# Patient Record
Sex: Male | Born: 1940
Health system: Southern US, Community
[De-identification: ages and names within clinical notes are randomized; demographics above are authoritative.]

## PROBLEM LIST (undated history)

## (undated) DIAGNOSIS — E782 Mixed hyperlipidemia: Secondary | ICD-10-CM

## (undated) DIAGNOSIS — I219 Acute myocardial infarction, unspecified: Secondary | ICD-10-CM

## (undated) DIAGNOSIS — I779 Disorder of arteries and arterioles, unspecified: Secondary | ICD-10-CM

## (undated) DIAGNOSIS — R001 Bradycardia, unspecified: Secondary | ICD-10-CM

## (undated) DIAGNOSIS — E119 Type 2 diabetes mellitus without complications: Secondary | ICD-10-CM

## (undated) DIAGNOSIS — I10 Essential (primary) hypertension: Secondary | ICD-10-CM

## (undated) DIAGNOSIS — W57XXXA Bitten or stung by nonvenomous insect and other nonvenomous arthropods, initial encounter: Secondary | ICD-10-CM

## (undated) DIAGNOSIS — I739 Peripheral vascular disease, unspecified: Secondary | ICD-10-CM

## (undated) DIAGNOSIS — I2510 Atherosclerotic heart disease of native coronary artery without angina pectoris: Secondary | ICD-10-CM

## (undated) HISTORY — DX: Essential (primary) hypertension: I10

## (undated) HISTORY — DX: Mixed hyperlipidemia: E78.2

## (undated) HISTORY — DX: Acute myocardial infarction, unspecified: I21.9

## (undated) HISTORY — DX: Peripheral vascular disease, unspecified: I73.9

## (undated) HISTORY — DX: Bradycardia, unspecified: R00.1

## (undated) HISTORY — DX: Bitten or stung by nonvenomous insect and other nonvenomous arthropods, initial encounter: W57.XXXA

## (undated) HISTORY — DX: Type 2 diabetes mellitus without complications: E11.9

## (undated) HISTORY — DX: Disorder of arteries and arterioles, unspecified: I77.9

## (undated) HISTORY — DX: Atherosclerotic heart disease of native coronary artery without angina pectoris: I25.10

## (undated) HISTORY — PX: NO PAST SURGERIES: SHX2092

---

## 2008-05-16 DIAGNOSIS — I219 Acute myocardial infarction, unspecified: Secondary | ICD-10-CM

## 2008-05-16 HISTORY — DX: Acute myocardial infarction, unspecified: I21.9

## 2008-06-09 ENCOUNTER — Ambulatory Visit: Payer: Self-pay | Admitting: Internal Medicine

## 2008-06-09 ENCOUNTER — Encounter: Payer: Self-pay | Admitting: Cardiology

## 2008-06-09 ENCOUNTER — Inpatient Hospital Stay (HOSPITAL_COMMUNITY): Admission: EM | Admit: 2008-06-09 | Discharge: 2008-06-12 | Payer: Self-pay | Admitting: Cardiology

## 2008-06-10 ENCOUNTER — Encounter: Payer: Self-pay | Admitting: Cardiology

## 2008-06-10 ENCOUNTER — Ambulatory Visit: Payer: Self-pay | Admitting: Vascular Surgery

## 2008-06-10 ENCOUNTER — Encounter: Payer: Self-pay | Admitting: Internal Medicine

## 2008-06-24 ENCOUNTER — Ambulatory Visit: Payer: Self-pay | Admitting: Cardiology

## 2008-08-04 ENCOUNTER — Encounter: Payer: Self-pay | Admitting: Cardiology

## 2008-08-10 ENCOUNTER — Ambulatory Visit: Payer: Self-pay | Admitting: Cardiology

## 2008-10-29 ENCOUNTER — Encounter: Payer: Self-pay | Admitting: Cardiology

## 2009-03-09 ENCOUNTER — Ambulatory Visit: Payer: Self-pay | Admitting: Cardiology

## 2009-03-12 ENCOUNTER — Encounter: Payer: Self-pay | Admitting: Cardiology

## 2009-07-20 ENCOUNTER — Encounter: Payer: Self-pay | Admitting: Cardiology

## 2009-07-27 ENCOUNTER — Encounter: Payer: Self-pay | Admitting: Cardiology

## 2009-07-27 DIAGNOSIS — I6529 Occlusion and stenosis of unspecified carotid artery: Secondary | ICD-10-CM

## 2009-07-30 ENCOUNTER — Encounter: Payer: Self-pay | Admitting: Cardiology

## 2009-08-26 ENCOUNTER — Encounter: Payer: Self-pay | Admitting: Physician Assistant

## 2009-08-26 ENCOUNTER — Ambulatory Visit: Payer: Self-pay | Admitting: Cardiology

## 2009-08-26 DIAGNOSIS — E782 Mixed hyperlipidemia: Secondary | ICD-10-CM

## 2009-08-26 DIAGNOSIS — I10 Essential (primary) hypertension: Secondary | ICD-10-CM

## 2009-10-25 ENCOUNTER — Encounter: Payer: Self-pay | Admitting: Physician Assistant

## 2009-10-27 ENCOUNTER — Encounter (INDEPENDENT_AMBULATORY_CARE_PROVIDER_SITE_OTHER): Payer: Self-pay | Admitting: *Deleted

## 2010-01-05 ENCOUNTER — Encounter: Payer: Self-pay | Admitting: Cardiology

## 2010-01-11 ENCOUNTER — Encounter: Payer: Self-pay | Admitting: Cardiology

## 2010-02-15 ENCOUNTER — Ambulatory Visit: Payer: Self-pay | Admitting: Cardiology

## 2010-02-15 DIAGNOSIS — I2510 Atherosclerotic heart disease of native coronary artery without angina pectoris: Secondary | ICD-10-CM | POA: Insufficient documentation

## 2010-05-30 ENCOUNTER — Encounter: Payer: Self-pay | Admitting: Cardiology

## 2010-06-01 ENCOUNTER — Ambulatory Visit: Payer: Self-pay | Admitting: Cardiology

## 2010-06-01 ENCOUNTER — Encounter: Payer: Self-pay | Admitting: Cardiology

## 2010-06-06 ENCOUNTER — Ambulatory Visit: Payer: Self-pay | Admitting: Cardiology

## 2010-06-23 ENCOUNTER — Encounter: Payer: Self-pay | Admitting: Cardiology

## 2010-06-23 ENCOUNTER — Ambulatory Visit: Payer: Self-pay | Admitting: Vascular Surgery

## 2010-06-30 ENCOUNTER — Encounter: Payer: Self-pay | Admitting: Cardiology

## 2010-06-30 ENCOUNTER — Encounter: Admission: RE | Admit: 2010-06-30 | Discharge: 2010-06-30 | Payer: Self-pay | Admitting: Vascular Surgery

## 2010-06-30 ENCOUNTER — Ambulatory Visit: Payer: Self-pay | Admitting: Vascular Surgery

## 2010-10-20 ENCOUNTER — Encounter (INDEPENDENT_AMBULATORY_CARE_PROVIDER_SITE_OTHER): Payer: Self-pay | Admitting: *Deleted

## 2010-11-15 ENCOUNTER — Encounter: Payer: Self-pay | Admitting: Cardiology

## 2010-11-15 NOTE — Letter (Signed)
Summary: [facility] - Office Visit  [facility] - Office Visit   Imported By: Marylou Mccoy 07/18/2010 15:25:52  _____________________________________________________________________  External Attachment:    Type:   Image     Comment:   External Document

## 2010-11-15 NOTE — Miscellaneous (Signed)
Summary: Orders Update  Clinical Lists Changes  Orders: Added new Referral order of Carotid Duplex (Carotid Duplex) - Signed Added new Referral order of Nuclear Med (Nuc Med) - Signed

## 2010-11-15 NOTE — Miscellaneous (Signed)
Summary: CARTOID DOPPLERS  Clinical Lists Changes  Orders: Added new Referral order of Carotid Duplex (Carotid Duplex) - Signed

## 2010-11-15 NOTE — Letter (Signed)
Summary: Engineer, materials at [facility]  [address]   [place], [place] [address]   Phone: [phone]  Fax: [phone]        October 27, 2009 MRN: [id number]    Robert [name] [address] [place], [place]  [address]    Dear Mr. [name],  Your test ordered by Selena Batten has been reviewed by your physician (or physician assistant) and was found to be normal or stable. Your physician (or physician assistant) felt no changes were needed at this time.  ____ Echocardiogram  ____ Cardiac Stress Test  _X___ Lab Work-continue current medication regimen  ____ Peripheral vascular study of arms, legs or neck  ____ CT scan or X-ray  ____ Lung or Breathing test  ____ Other:   Thank you.   Cyril Loosen, RN, BSN    Duane Boston, M.D., F.A.C.C. Thressa Sheller, M.D., F.A.C.C. Oneal Grout, M.D., F.A.C.C. Cheree Ditto, M.D., F.A.C.C. Daiva Nakayama, M.D., F.A.C.C. Kenney Houseman, M.D., F.A.C.C. Jeanne Ivan, PA-C

## 2010-11-15 NOTE — Assessment & Plan Note (Signed)
Summary: 6 MON FUL FH   Visit Type:  Follow-up Primary Provider:  Dr. Erasmo Downer   History of Present Illness: 70 year old [name] presents for followup. I saw him back in May for routine followup. Followup testing is outlined below including carotid Dopplers and Cardiolite. He has shown progression in carotid stenosis on the left. We discussed this today as well as referral to vascular surgery.   He denies any significant angina or progressive shortness of breath. Cardiolite demonstrated evidence of scar without frank ischemia, LVEF 53%.  No description of progressive claudication symptoms. No palpitations or syncope.  Current Medications (verified): 1)  Plavix 75 Mg Tabs (Clopidogrel Bisulfate) .... Take One Tablet By Mouth Daily 2)  Carvedilol 6.25 Mg Tabs (Carvedilol) .... Take One Tablet By Mouth Twice A Day 3)  Crestor 10 Mg Tabs (Rosuvastatin Calcium) .... Take 1 Tab Daily 4)  Avodart 0.5 Mg Caps (Dutasteride) .... Take 1 Tablet By Mouth Once A Day 5)  Aspirin 81 Mg Tbec (Aspirin) .... Take One Tablet By Mouth Daily 6)  Metformin Hcl 500 Mg Tabs (Metformin Hcl) .... Take 1 Tablet By Mouth Two Times A Day 7)  Cozaar 100 Mg Tabs (Losartan Potassium) .... Take 1 Tablet By Mouth Once A Day 8)  Ambien 5 Mg Tabs (Zolpidem Tartrate) .... Take 1 Tablet By Mouth Once A Day As Needed 9)  Nitrostat 0.4 Mg Subl (Nitroglycerin) .... Use As Directed 10)  Viagra 100 Mg Tabs (Sildenafil Citrate) .... Use As Directed 11)  Claritin 10 Mg Tabs (Loratadine) .... Take 1 Tablet By Mouth Once A Day 12)  Hydrochlorothiazide 25 Mg Tabs (Hydrochlorothiazide) .... Take 1 Tablet By Mouth Once A Day  Allergies (verified): No Known Drug Allergies  Comments:  Nurse/Medical Assistant: The patient's medication bottles and allergies were reviewed with the patient and were updated in the Medication and Allergy Lists.  Past History:  Past Medical History: Last updated: 08/26/2009 Diabetes Type  2 Hyperlipidemia Hypertension Carotid artery disease (Robert-70%, bilateral, 10/10) Coronary artery disease - DES to LAD/OM August 2009 Myocardial Infarction - August 2009 out-of-hospital late presentation inferoposterolateral myocardial infarction Sinus bradycardia history of tobacco  Social History: Last updated: 08/10/2008 Retired  Tobacco Use - Former.  Alcohol Use - no  Review of Systems  The patient denies anorexia, fever, chest pain, syncope, dyspnea on exertion, peripheral edema, abdominal pain, melena, and hematochezia.         Otherwise reviewed and negative.  Vital Signs:  Patient profile:   70 year old [name] Height:      69 inches Weight:      202 pounds BMI:     29.94 Pulse rate:   57 / minute BP sitting:   137 / 85  (left arm) Cuff size:   regular  Vitals Entered By: Carlye Grippe (June 06, 2010 11:11 AM)  Nutrition Counseling: Patient's BMI is greater than 25 and therefore counseled on weight management options.  Physical Exam  Additional Exam:  Overweight [name] in no acute distress. HEENT: Conjunctiva and lids normal, oropharynx with moist mucosa. Neck: Supple, no elevated JVP, soft left carotid bruit. Lungs: Diminished breath sounds, nonlabored. Cardiac: Regular rate and rhythm, no S3 gallop. Abdomen: Soft, nontender, bowel sounds present. Skin: Warm and dry. Musculoskeletal: No kyphosis. Extremities: No pitting edema, distal pulses 2+. Neuropsychiatric: Alert and oriented x3, affect appropriate.   Carotid Doppler  Procedure date:  06/01/2010  Findings:      Greater than 70% LICA stenosis, Robert-69% RICA stenosis.  Nuclear  Study  Procedure date:  06/01/2010  Findings:      No diagnostic ST segment changes. Large inferolateral defect, fixed, consistent with previous infarct scar. No frank ischemia. LVEF 53%.  Impression & Recommendations:  Problem # 1:  CORONARY ATHEROSCLEROSIS NATIVE CORONARY ARTERY (ICD-414.01)  Symptomatically stable on  medical therapy. Followup Cardiolite shows inferolateral scar without frank ischemia, LVEF 53%. Followup in 6 months.  His updated medication list for this problem includes:    Plavix 75 Mg Tabs (Clopidogrel bisulfate) .Marland Kitchen... Take one tablet by mouth daily    Carvedilol 6.25 Mg Tabs (Carvedilol) .Marland Kitchen... Take one tablet by mouth twice a day    Aspirin 81 Mg Tbec (Aspirin) .Marland Kitchen... Take one tablet by mouth daily    Nitrostat 0.4 Mg Subl (Nitroglycerin) ..... Use as directed  Problem # 2:  CAROTID STENOSIS (ICD-433.10)  Progressive disease on the left, now greater than 70%, with Robert-69% RICA stenosis. Referral made to vascular surgery.  His updated medication list for this problem includes:    Plavix 75 Mg Tabs (Clopidogrel bisulfate) .Marland Kitchen... Take one tablet by mouth daily    Aspirin 81 Mg Tbec (Aspirin) .Marland Kitchen... Take one tablet by mouth daily  Orders: [facility] Referral ([facility] Ref)  Problem # 3:  HYPERTENSION (ICD-401.9)  Blood pressure reasonable today.  His updated medication list for this problem includes:    Carvedilol 6.25 Mg Tabs (Carvedilol) .Marland Kitchen... Take one tablet by mouth twice a day    Aspirin 81 Mg Tbec (Aspirin) .Marland Kitchen... Take one tablet by mouth daily    Cozaar 100 Mg Tabs (Losartan potassium) .Marland Kitchen... Take 1 tablet by mouth once a day    Hydrochlorothiazide 25 Mg Tabs (Hydrochlorothiazide) .Marland Kitchen... Take 1 tablet by mouth once a day  Problem # 4:  DYSLIPIDEMIA (ICD-272.4)  For followup fasting lipid profile and liver function tests prior to next visit.  His updated medication list for this problem includes:    Crestor 10 Mg Tabs (Rosuvastatin calcium) .Marland Kitchen... Take 1 tab daily  Patient Instructions: 1)  Your physician wants you to follow-up in: 6 months. You will receive a reminder letter in the mail one-two months in advance. If you don't receive a letter, please call our office to schedule the follow-up appointment. 2)  Your physician recommends that you go to the [facility] for a FASTING lipid  profile and liver function labs:  DO IN 6 MONTHS BEFORE NEXT OFFICE VISIT. 3)  You have been referred to [facility] for carotid stenosis. 4)  Your physician recommends that you continue on your current medications as directed. Please refer to the Current Medication list given to you today. Prescriptions: PLAVIX 75 MG TABS (CLOPIDOGREL BISULFATE) Take one tablet by mouth daily  #90 x 3   Entered by:   Cyril Loosen, RN, BSN   Authorized by:   Loreli Slot, MD, [facility]   Signed by:   Cyril Loosen, RN, BSN on 06/06/2010   Method used:   Faxed to ...       [facility] education [facility])       [address] [address]       [place], [place]  [address]       Ph: [phone]       Fax: [phone]   RxID:   [id number] CARVEDILOL 6.25 MG TABS (CARVEDILOL) Take one tablet by mouth twice a day  #180 x 3   Entered by:   Cyril Loosen, RN, BSN   Authorized by:   Loreli Slot, MD, [facility]  Signed by:   Cyril Loosen, RN, BSN on 06/06/2010   Method used:   Faxed to ...       [facility] Environmental education officer)       [address] [address]       [place], [place]  [address]       Ph: [phone]       Fax: [phone]   RxID:   [id number]

## 2010-11-15 NOTE — Letter (Signed)
Summary: [facility]   [facility]   Imported By: Roderic Ovens 08/01/2010 17:18:31  _____________________________________________________________________  External Attachment:    Type:   Image     Comment:   External Document

## 2010-11-15 NOTE — Assessment & Plan Note (Signed)
Summary: 6 MONTH FU-RECV LETTER   Visit Type:  Follow-up Primary Provider:  Dr. Erasmo Downer   History of Present Illness: 70 year old male presents for a followup visit. He reports doing fairly well, with only occasional angina, perhaps once every 2 months. He has not required any nitroglycerin. He continues to walk regularly with his dogs on his property. He reports compliance with his medications.  Followup labs from January 2011 revealed AST 18, ALT 18, total cholesterol 98, triglycerides 128, HDL 26, LDL 46.   Followup carotid duplex from 29 March shows 50-69% stenoses bilateral internal carotid arteries. He has had no speech, memory, or motor deficits.  Since his last visit, Dr. Linna Darner placed him on hydrochlorothiazide for better blood pressure management.  Current Medications (verified): 1)  Plavix 75 Mg Tabs (Clopidogrel Bisulfate) .... Take One Tablet By Mouth Daily 2)  Carvedilol 6.25 Mg Tabs (Carvedilol) .... Take One Tablet By Mouth Twice A Day 3)  Crestor 10 Mg Tabs (Rosuvastatin Calcium) .... Take 1 Tab Daily 4)  Avodart 0.5 Mg Caps (Dutasteride) .... Take 1 Tablet By Mouth Once A Day 5)  Aspirin 81 Mg Tbec (Aspirin) .... Take One Tablet By Mouth Daily 6)  Metformin Hcl 500 Mg Tabs (Metformin Hcl) .... Take 1 Tablet By Mouth Two Times A Day 7)  Cozaar 50 Mg Tabs (Losartan Potassium) .... Take 1 Tablet By Mouth Once A Day 8)  Ambien 5 Mg Tabs (Zolpidem Tartrate) .... Take 1 Tablet By Mouth Once A Day As Needed 9)  Nitrostat 0.4 Mg Subl (Nitroglycerin) .... Use As Directed 10)  Viagra 100 Mg Tabs (Sildenafil Citrate) .... Use As Directed 11)  Claritin 10 Mg Tabs (Loratadine) .... Take 1 Tablet By Mouth Once A Day 12)  Hydrochlorothiazide 25 Mg Tabs (Hydrochlorothiazide) .... Take 1 Tab Daily  Allergies (verified): No Known Drug Allergies  Past History:  Past Medical History: Last updated: 08/26/2009 Diabetes Type 2 Hyperlipidemia Hypertension Carotid artery  disease (50-70%, bilateral, 10/10) Coronary artery disease - DES to LAD/OM August 2009 Myocardial Infarction - August 2009 out-of-hospital late presentation inferoposterolateral myocardial infarction Sinus bradycardia history of tobacco  Social History: Last updated: 08/10/2008 Retired  Tobacco Use - Former.  Alcohol Use - no  Review of Systems  The patient denies anorexia, fever, weight loss, syncope, dyspnea on exertion, peripheral edema, prolonged cough, headaches, melena, hematochezia, and severe indigestion/heartburn.         No claudication. Appetite stable. Otherwise reviewed and negative.  Vital Signs:  Patient profile:   70 year old male Weight:      203 pounds Pulse rate:   62 / minute BP sitting:   137 / 82  (right arm)  Vitals Entered By: Dreama Saa, CNA (Feb 15, 2010 8:46 AM)  Physical Exam  Additional Exam:  Overweight male in no acute distress. HEENT: Conjunctiva and lids normal, oropharynx with moist mucosa. Neck: Supple, no elevated JVP, soft left carotid bruit. Lungs: Diminished breath sounds, nonlabored. Cardiac: Regular rate and rhythm, no S3 gallop. Abdomen: Soft, nontender, bowel sounds present. Skin: Warm and dry. Musculoskeletal: No kyphosis. Extremities: No pitting edema, distal pulses 2+. Neuropsychiatric: Alert and oriented x3, affect appropriate.   Carotid Doppler  Procedure date:  01/11/2010  Findings:      50-69% diameter stenoses involving the proximal ICAs bilaterally by Doppler criteria. Only interval change is increase in left ICA peak systolic velocity. Calcified and noncalcified plaque in the carotid bulbs extending into the proximal ICAs and PCAs bilaterally.  EKG  Procedure date:  02/15/2010  Findings:      Normal sinus rhythm at 61 beats per minute with evidence of previous inferior wall infarct, incomplete right bundle branch block, and nonspecific ST-T changes.  Impression & Recommendations:  Problem # 1:  CORONARY  ATHEROSCLEROSIS NATIVE CORONARY ARTERY (ICD-414.01)  Relatively stable with occasional angina on present medical regimen. We plan a followup Lexiscan Cardiolite in 6 months prior to his next visit, on medical therapy for ischemic surveillance. Percutaneous intervention was last done in August 2009.  His updated medication list for this problem includes:    Plavix 75 Mg Tabs (Clopidogrel bisulfate) .Marland Kitchen... Take one tablet by mouth daily    Carvedilol 6.25 Mg Tabs (Carvedilol) .Marland Kitchen... Take one tablet by mouth twice a day    Aspirin 81 Mg Tbec (Aspirin) .Marland Kitchen... Take one tablet by mouth daily    Nitrostat 0.4 Mg Subl (Nitroglycerin) ..... Use as directed  Problem # 2:  DYSLIPIDEMIA (ICD-272.4)  LDL well controlled on present regimen.  His updated medication list for this problem includes:    Crestor 10 Mg Tabs (Rosuvastatin calcium) .Marland Kitchen... Take 1 tab daily  Problem # 3:  HYPERTENSION (ICD-401.9)  Blood pressure coming under better control following addition of hydrochlorothiazide by Dr. Linna Darner.  His updated medication list for this problem includes:    Carvedilol 6.25 Mg Tabs (Carvedilol) .Marland Kitchen... Take one tablet by mouth twice a day    Aspirin 81 Mg Tbec (Aspirin) .Marland Kitchen... Take one tablet by mouth daily    Cozaar 50 Mg Tabs (Losartan potassium) .Marland Kitchen... Take 1 tablet by mouth once a day    Hydrochlorothiazide 25 Mg Tabs (Hydrochlorothiazide) .Marland Kitchen... Take 1 tab daily  Problem # 4:  CAROTID STENOSIS (ICD-433.10)  Moderate bilateral disease. Followup carotid duplex scheduled for 6 months.  His updated medication list for this problem includes:    Plavix 75 Mg Tabs (Clopidogrel bisulfate) .Marland Kitchen... Take one tablet by mouth daily    Aspirin 81 Mg Tbec (Aspirin) .Marland Kitchen... Take one tablet by mouth daily  Patient Instructions: 1)  Your physician has requested that you have a carotid duplex. This test is an ultrasound of the carotid arteries in your neck. It looks at blood flow through these arteries that supply the  brain with blood. Allow one hour for this exam. There are no restrictions or special instructions. IN 6 MONTHS, BEFORE YOUR NEXT OFFICE VISIT. 2)  Your physician has requested that you have an Best boy.  For further information please visit [email or web address]  Please follow instruction sheet, as given. IN 6 MONTHS, BEFORE YOUR NEXT OFFICE VISIT. 3)  Your physician wants you to follow-up in: 6 months. You will receive a reminder letter in the mail one-two months in advance. If you don't receive a letter, please call our office to schedule the follow-up appointment.

## 2010-11-17 NOTE — Miscellaneous (Signed)
Summary: FLP,LFT  Clinical Lists Changes  Orders: Added new Test order of T-Hepatic Function [phone]) - Signed Added new Test order of T-Lipid Profile [phone]) - Signed

## 2010-11-25 ENCOUNTER — Encounter: Payer: Self-pay | Admitting: Cardiology

## 2010-11-25 ENCOUNTER — Ambulatory Visit (INDEPENDENT_AMBULATORY_CARE_PROVIDER_SITE_OTHER): Payer: BC Managed Care – PPO | Admitting: Cardiology

## 2010-11-25 DIAGNOSIS — I2510 Atherosclerotic heart disease of native coronary artery without angina pectoris: Secondary | ICD-10-CM

## 2010-11-25 DIAGNOSIS — Z951 Presence of aortocoronary bypass graft: Secondary | ICD-10-CM

## 2010-12-01 NOTE — Progress Notes (Signed)
Summary: Office Visit/ [facility] OFFICE VISIT  Office Visit/ [facility] OFFICE VISIT   Imported By: Dorise Hiss 11/25/2010 08:24:13  _____________________________________________________________________  External Attachment:    Type:   Image     Comment:   External Document

## 2010-12-01 NOTE — Progress Notes (Signed)
Summary: Office Visit/ [facility] OFFICE VISIT  Office Visit/ [facility] OFFICE VISIT   Imported By: Dorise Hiss 11/25/2010 08:25:31  _____________________________________________________________________  External Attachment:    Type:   Image     Comment:   External Document

## 2010-12-16 IMAGING — CT CT ANGIO NECK
1 of 9 series · 3 of 33 positions shown · IV contrast (omnipaque)
Comparison: None.

CLINICAL DATA: 69-year-old male with suspected carotid stenosis.

CT ANGIOGRAPHY NECK
TECHNIQUE: Multidetector CT imaging of the neck was performed
using the standard protocol during bolus administration of
intravenous contrast.  Multiplanar CT image reconstructions
including MIPs were obtained to evaluate the vascular anatomy.
Carotid stenosis measurements (when applicable) are obtained
utilizing NASCET criteria, using the distal internal carotid
diameter as the denominator.
Contrast:  100 ml Omnipaque 350.

[Series 5: cta neck · axial · 0.39mm/px · z∈[-338,-58]mm · 3 of 113 slices shown]
[im 1/113  soft-tissue]
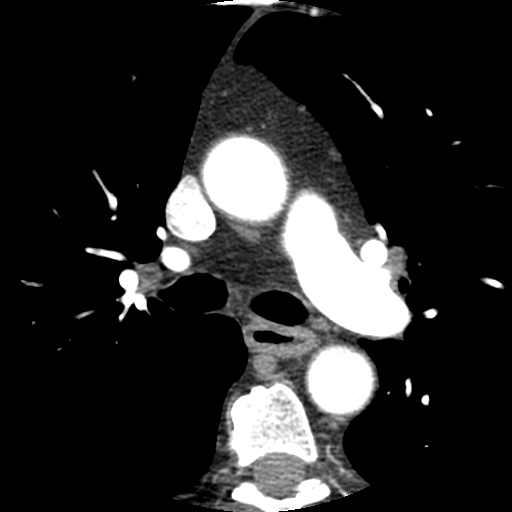
[im 57/113  bone]
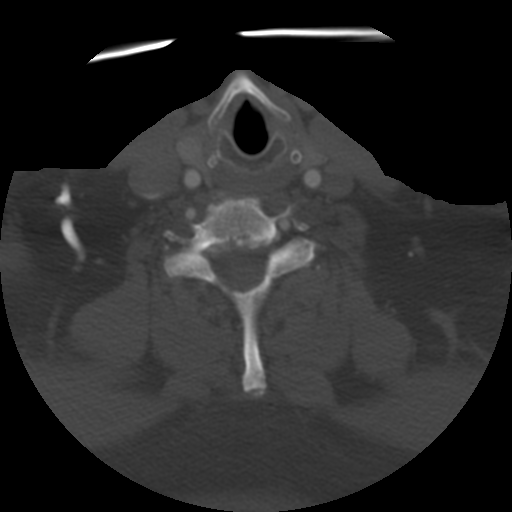
[im 113/113  soft-tissue]
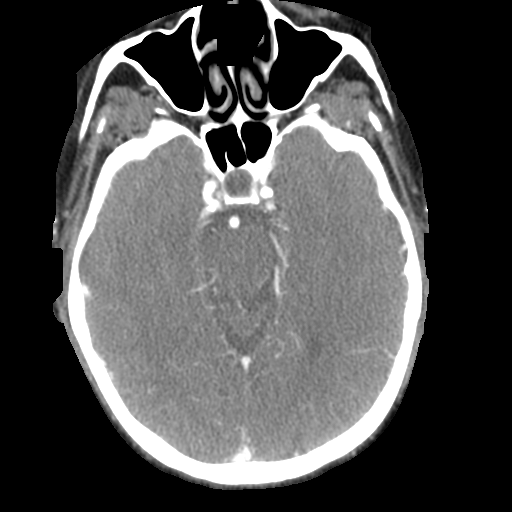

[3 of 33 positions shown; findings below may reference images not displayed]

FINDINGS: Visualized lungs are clear.  Degenerative changes in the
spine. No acute osseous abnormality identified.  Incidental
dystrophic calcifications of the lingual tonsil. No
lymphadenopathy.  Pharyngeal mucosal spaces and the thyroid are
within normal limits.  Parapharyngeal, retropharyngeal and
sublingual spaces are within normal limits.  Submandibular and
parotid glands are within normal limits. Visualized mediastinum is
within normal limits.

Vascular Findings: Mild calcified atherosclerosis of the aortic
arch.  Three-vessel arch configuration.

Normal bilateral common carotid artery origins.  Normal right
vertebral artery origin.  Mild calcified atherosclerosis at the
left vertebral artery origin resulting in 40-45 % with respect to
the distal vessel.

The vertebral arteries are codominant in the neck.  The right has a
late entry into the transverse foramen.  No other cervical
vertebral artery atherosclerosis or stenosis.  Visualized
intracranial distal vertebral arteries are remarkable for calcified
sclerosis and irregularity.  There is mild to moderate focal
stenosis in the left V4 segment (series 601 image 21), otherwise
the vertebrobasilar junction appears widely patent.

Right common carotid artery is within normal limits.  At the right
carotid bifurcation there is dense calcified plaque extending into
the right ICA origin.  Subsequent stenosis measures up to 46 % with
respect to the distal vessel.  The cervical right ICA is somewhat
diminutive measuring roughly 4 mm in diameter.  There is moderate
tortuosity of the distal right ICA without additional stenosis.
Visualized right ICA siphon is patent with minimal atherosclerosis.

 Left common carotid artery is normal.  At the left carotid
bifurcation there is soft and calcified plaque involving the left
ICA origin and extending in the posterior bulb.  Stenosis measures
up to 56 % with respect to the distal vesselthere is severe
tortuosity of the distal left cervical ICA just below the skull
base.  Size is comparable to the right ICA, approximate 4 mm.
Visualized left ICA siphon is patent.  There is there is mild
calcified atherosclerosis which is more pronounced than on the
right.

 Review of the MIP images confirms the above findings.
IMPRESSION: 1.  Calcified and soft plaque at the left ICA origin and posterior
bulb with stenosis calculated up to 56 % with respect to the distal
vessel.
2.  Calcified atherosclerosis of the right ICA origin with stenosis
calculated up to 46 % with respect to the distal vessel.
3.  Calcified atherosclerosis at the left vertebral artery origin
with stenosis calculated up to 45 % with respect to the distal
vessel.
4.  Tortuous cervical ICAs.
5.  Mild to moderate visualized intracranial atherosclerosis,
including a mild to moderate left vertebral artery V4 segment
stenosis.

## 2010-12-22 NOTE — Assessment & Plan Note (Signed)
Summary: 6 MO FU PER FEB REMINDER- JM   Visit Type:  Follow-up Primary Provider:  Hasanaj   History of Present Illness: the patient is a pleasant 70 year old male patient of Dr. Diona Browner.the patient is seen in follow-up today.  He has a history of coronary artery disease with drug alluding stent placement in 2009.  He had a Cardiolite stress study done approximately 5 months ago which showed no ischemia and a small scar consistent with prior infarct.  His ejection fraction is 53%.  Prior to this visit he had lipid panel done in cholesterol.  His LDL cholesterol was 32 mg percent with a total cholesterol 98.  Instructions were given by Dr. Diona Browner to decrease his Crestor from 10 mg to 5 mg.  he is doing well.  He denies any substernal chest pain.  The patient is very active.  He does state that since his heart attack he has had a soreness in his chest but he clearly has no exertional angina.  The patienthas [place] acresof property behind his house where he goes hunting and marked contractures and trailers.  He reports no symptoms during this.  He has no exercise intolerance.  He does have a history of hypertension diabetes mellitus which is controlled.  The patient also has carotid artery disease with an initial suggestion that he had significant disease with Dopplers performed at the [facility] vascular lab.Marland Kitchen  However, the patient saw Dr. Elise Benne in follow-up and he felt that the stenosis was only 50% bilaterally.  The patient had a CT angiogram done to confirm this.  Preventive Screening-Counseling & Management  Alcohol-Tobacco     Smoking Status: quit     Year Quit: 1985  Current Medications (verified): 1)  Plavix 75 Mg Tabs (Clopidogrel Bisulfate) .... Take One Tablet By Mouth Daily 2)  Carvedilol 6.25 Mg Tabs (Carvedilol) .... Take One Tablet By Mouth Twice A Day 3)  Crestor 10 Mg Tabs (Rosuvastatin Calcium) .... Take 1/2 Tablet By Mouth At Bedtime. 4)  Avodart 0.5 Mg Caps (Dutasteride)  .... Take 1 Tablet By Mouth Once A Day 5)  Aspirin 81 Mg Tbec (Aspirin) .... Take One Tablet By Mouth Daily 6)  Metformin Hcl 500 Mg Tabs (Metformin Hcl) .... Take 1 Tablet By Mouth Two Times A Day 7)  Cozaar 100 Mg Tabs (Losartan Potassium) .... Take 1 Tablet By Mouth Once A Day 8)  Ambien 5 Mg Tabs (Zolpidem Tartrate) .... Take 1 Tablet By Mouth Once A Day As Needed 9)  Nitrostat 0.4 Mg Subl (Nitroglycerin) .... Use As Directed 10)  Viagra 100 Mg Tabs (Sildenafil Citrate) .... Use As Directed 11)  Claritin 10 Mg Tabs (Loratadine) .... Take 1 Tablet By Mouth Once A Day 12)  Hydrochlorothiazide 25 Mg Tabs (Hydrochlorothiazide) .... Take 1 Tablet By Mouth Once A Day 13)  Glipizide 5 Mg Tabs (Glipizide) .... Take 1 Tablet By Mouth Two Times A Day  Allergies (verified): No Known Drug Allergies  Comments:  Nurse/Medical Assistant: The patient's medication list and allergies were reviewed with the patient and were updated in the Medication and Allergy Lists.  Past History:  Past Medical History: Diabetes Type 2 Hyperlipidemia, treated LDL 32 mg percent January 2012 Hypertension Carotid artery disease (50-70%, bilateral, 10/10) Coronary artery disease - DES to LAD/OM August 2009 Myocardial Infarction - August 2009 out-of-hospital late presentation inferoposterolateral myocardial infarction Sinus bradycardia history of tobacco CT angiogram 2011 50% bilateral internal carotid artery stenosis nuclear perfusion study August 2011 small scar no  ischemia ejection fraction 53%.  Review of Systems  The patient denies fatigue, malaise, fever, weight gain/loss, vision loss, decreased hearing, hoarseness, chest pain, palpitations, shortness of breath, prolonged cough, wheezing, sleep apnea, coughing up blood, abdominal pain, blood in stool, nausea, vomiting, diarrhea, heartburn, incontinence, blood in urine, muscle weakness, joint pain, leg swelling, rash, skin lesions, headache, fainting,  dizziness, depression, anxiety, enlarged lymph nodes, easy bruising or bleeding, and environmental allergies.    Vital Signs:  Patient profile:   70 year old male Height:      69 inches Weight:      210 pounds Pulse rate:   53 / minute BP sitting:   138 / 79  (left arm) Cuff size:   large  Vitals Entered By: Carlye Grippe (November 25, 2010 8:28 AM)  Physical Exam  Additional Exam:  General: Well-developed, well-nourished in no distress head: Normocephalic and atraumatic eyes PERRLA/EOMI intact, conjunctiva and lids normal nose: No deformity or lesions mouth normal dentition, normal posterior pharynx neck: Supple, no JVD.  No masses, thyromegaly or abnormal cervical nodes lungs: Normal breath sounds bilaterally without wheezing.  Normal percussion heart: regular rate and rhythm with normal S1 and S2, no S3 or S4.  PMI is normal.  No pathological murmurs abdomen: Normal bowel sounds, abdomen is soft and nontender without masses, organomegaly or hernias noted.  No hepatosplenomegaly musculoskeletal: Back normal, normal gait muscle strength and tone normal pulsus: Pulse is normal in all 4 extremities Extremities: No peripheral pitting edema neurologic: Alert and oriented x 3 skin: Intact without lesions or rashes cervical nodes: No significant adenopathy psychologic: Normal affect    Impression & Recommendations:  Problem # 1:  CORONARY ATHEROSCLEROSIS NATIVE CORONARY ARTERY (ICD-414.01) the patient reports no recurrent chest pain.  He has no definite angina.  He has not used any nitroglycerin.  He has been compliant with Plavix.  He will continue with the current medical therapy. His updated medication list for this problem includes:    Plavix 75 Mg Tabs (Clopidogrel bisulfate) .Marland Kitchen... Take one tablet by mouth daily    Carvedilol 6.25 Mg Tabs (Carvedilol) .Marland Kitchen... Take one tablet by mouth twice a day    Aspirin 81 Mg Tbec (Aspirin) .Marland Kitchen... Take one tablet by mouth daily    Nitrostat  0.4 Mg Subl (Nitroglycerin) ..... Use as directed  Problem # 2:  HYPERTENSION (ICD-401.9) blood pressure is now under good control with the addition of a diuretic.  The patient consulted his primary care physician His updated medication list for this problem includes:    Carvedilol 6.25 Mg Tabs (Carvedilol) .Marland Kitchen... Take one tablet by mouth twice a day    Aspirin 81 Mg Tbec (Aspirin) .Marland Kitchen... Take one tablet by mouth daily    Cozaar 100 Mg Tabs (Losartan potassium) .Marland Kitchen... Take 1 tablet by mouth once a day    Hydrochlorothiazide 25 Mg Tabs (Hydrochlorothiazide) .Marland Kitchen... Take 1 tablet by mouth once a day  Problem # 3:  CAROTID STENOSIS (ICD-433.10) the patient has a proximal 50% bilateral carotid stenosis.  He is now followed by vascular surgery.  Risk reduction was stressed to the patient. His updated medication list for this problem includes:    Plavix 75 Mg Tabs (Clopidogrel bisulfate) .Marland Kitchen... Take one tablet by mouth daily    Aspirin 81 Mg Tbec (Aspirin) .Marland Kitchen... Take one tablet by mouth daily  Problem # 4:  DYSLIPIDEMIA (ICD-272.4) Crestor was decreased to 5 milligrams p.o. daily.  During his next visit with Dr. Diona Browner in 6 months  the patient to have additional lab work done. His updated medication list for this problem includes:    Crestor 10 Mg Tabs (Rosuvastatin calcium) .Marland Kitchen... Take 1/2 tablet by mouth at bedtime.  Patient Instructions: 1)  Your physician recommends that you continue on your current medications as directed. Please refer to the Current Medication list given to you today. 2)  Your physician wants you to follow-up in: 6 months.  You will receive a reminder letter in the mail two months in advance. If you don't receive a letter, please call our office to schedule the follow-up appointment.

## 2010-12-29 ENCOUNTER — Other Ambulatory Visit (INDEPENDENT_AMBULATORY_CARE_PROVIDER_SITE_OTHER): Payer: BC Managed Care – PPO

## 2010-12-29 DIAGNOSIS — I6529 Occlusion and stenosis of unspecified carotid artery: Secondary | ICD-10-CM

## 2011-01-05 NOTE — Procedures (Unsigned)
CAROTID DUPLEX EXAM  INDICATION:  Follow up carotid artery disease.  HISTORY: Diabetes:  Yes. Cardiac:  Yes, coronary artery disease. Hypertension:  Yes. Smoking:  Previous. Previous Surgery:  No. CV History:  Patient is currently asymptomatic. Amaurosis Fugax No, Paresthesias No, Hemiparesis No.                                      RIGHT             LEFT Brachial systolic pressure:         136               146 Brachial Doppler waveforms:         WNL               WNL Vertebral direction of flow:        Antegrade         Antegrade DUPLEX VELOCITIES (cm/sec) CCA peak systolic                   68                82 ECA peak systolic                   124               100 ICA peak systolic                   104               124 ICA end diastolic                   34                29 PLAQUE MORPHOLOGY:                  Heterogenous      Heterogenous PLAQUE AMOUNT:                      Mild-to-moderate  Mild-to-moderate PLAQUE LOCATION:                    Bifurcation, ICA  Bifurcation, ICA  IMPRESSION: 1. Right side, no hemodynamically significant stenosis. 2. Left side is 40% to 59% stenosis. 3. Right vertebral is prominent. 4. Study is stable compared to previous.  ___________________________________________ Janetta Hora Fields, MD  OD/MEDQ  D:  12/29/2010  T:  12/29/2010  Job:  [id number]

## 2011-02-28 NOTE — Assessment & Plan Note (Signed)
[facility]                          [facility] OFFICE NOTE   Robert [name], Robert [name]                      MRN:          [id number]  DATE:03/09/2009                            DOB:          [birth year]    PRIMARY CARE PHYSICIAN:  Robert Downer, MD   REASON FOR VISIT:  Scheduled followup.   HISTORY OF PRESENT ILLNESS:  Robert [name] was last seen in October 2009.  He reports doing reasonably well.  He does have occasional angina,  perhaps once a month, although no progressive pattern, and not requiring  any sublingual nitroglycerin.  Dr. Linna [name] appropriately switched [name] from lisinopril to Cozaar due to complaints of a dry cough.  This has improved.  His last lipid profile in January of this year  showed an LDL of 38 and an HDL of 29.  His Crestor dosing has been cut  in half.  He has had no stroke or TIA symptoms.  Last carotid duplex in  August 2009 revealed a 60-79% left internal carotid artery stenosis.   ALLERGIES:  No known drug allergies.   MEDICATIONS:  1. Avodart 0.5 mg p.o. daily.  2. Plavix 75 mg p.o. daily.  3. Aspirin 325 mg p.o. daily.  4. Carvedilol 6.25 mg p.o. b.i.d.  5. Metformin 500 mg p.o. b.i.d.  6. Crestor 20 mg p.o. nightly.  7. Cozaar 50 mg p.o. daily.  8. Ambien 5 mg p.o. nightly p.r.n.  9. Sublingual nitroglycerin 0.4 mg p.r.n.  10.Viagra p.r.n.  11.Claritin p.r.n.   REVIEW OF SYSTEMS:  As outlined above.  Otherwise reviewed and negative.   PHYSICAL EXAMINATION:  VITAL SIGNS:  Blood pressure 140/84, heart rate  is 56, and weight is 203 pounds.  GENERAL:  The patient is comfortable in no acute distress.  HEENT:  Conjunctiva is normal.  Oropharynx is clear.  NECK:  Supple.  No elevated jugular venous pressure.  Soft left carotid  bruit.  No thyromegaly.  LUNGS:  Clear without labored breathing.  CARDIAC:  Regular rate and rhythm, 2/6 systolic murmur at the base.  Second heart sound is normal.  No S3 gallop  or pericardial rub.  ABDOMEN:  Soft and nontender.  Normoactive bowel sounds.  EXTREMITIES:  Significant pitting edema.  Distal pulses are 2+.  SKIN:  Warm and dry.  MUSCULOSKELETAL:  No kyphosis noted.  NEUROPSYCHIATRIC:  The patient is alert and oriented x3.  Affect is  appropriate.   IMPRESSION AND RECOMMENDATIONS:  1. Multivessel cardiovascular disease, status post drug-eluting stent      placement in left anterior descending in first obtuse marginal with      an ejection fraction of 50%.  Symptomatically, Robert [name] has only      occasional angina, without progressive pattern.  We will plan to      continue medical therapy at this time.  Followup will be arranged      over the next 6 months for symptom review.  2. Hyperlipidemia, on Crestor.  We will plan followup lipid profile      and liver function tests for reassessment.  3.  Moderate left internal carotid artery stenosis.  Followup Dopplers      will be obtained prior to his next visit.     Robert Sidle, MD  Electronically Signed    SGM/MedQ  DD: 03/09/2009  DT: 03/10/2009  Job #: [id number]   cc:   Robert Downer, MD

## 2011-02-28 NOTE — H&P (Signed)
NAME:  VINEETH, FELL NO.:  [id number]   MEDICAL RECORD NO.:  [id number]          PATIENT TYPE:  OBV   LOCATION:  2918                         FACILITY:  [facility]   PHYSICIAN:  Bevelyn Buckles. Bensimhon, MDDATE OF BIRTH:  07-11-1941   DATE OF ADMISSION:  06/09/2008  DATE OF DISCHARGE:                              HISTORY & PHYSICAL   PRIMARY CARE PHYSICIAN:  Erasm[place]  He  is new to [facility].   REASON FOR ADMISSION:  Myocardial infarction.   HISTORY OF PRESENT ILLNESS:  Mr. Carrero is a very pleasant 70 year old  male with a history of hypertension, hyperlipidemia, diabetes x 5-6  years and obesity.  He denies any history of known CAD.  Last Thursday  he was sitting at home reading the newspaper, when he developed severe  chest pain radiating to the shoulders.  This lasted 1-2 hours he is  short of breath and sweaty.  He just sort of toughed it out.  He felt  short of breath for 1-2 days, like it could not get a good deep breath,  but by Saturday he was back to baseline.  His family was urging him to  go to the doctor today to get checked.  He went to the doctor.  He had  an abnormal EKG.  He was sent to the [facility] ER.  A troponin was 6.7  with an MB of 3.3.  He is transferred for further evaluation.  He denies  any chest pain since last week.  He has not had any orthopnea, no PND.  No bright red blood per rectum.  No melena.   The remainder of the review of systems is negative, except for HPI and  problem list.   PROBLEM LIST:  1. Hypertension.  2. Diabetes.  3. Hyperlipidemia.  4. Obesity.   MEDICATIONS AT HOME:  Avodart 0.5 mg a day, doxazosin 4 mg a day,  metformin 500 b.i.d., atenolol 50 a day, Ambien 5 mg at night.   ALLERGIES:  NO KNOWN DRUG ALLERGIES.   SOCIAL HISTORY:  He is retired, used to work in a [employer].  Has  a history of tobacco, but quit 25 years ago.  No significant alcohol.   FAMILY  HISTORY:  Notable for coronary artery disease in several  relatives.   PHYSICAL EXAMINATION:  He is lying flat in bed in no acute distress.  Blood pressure is 155/81.  His heart rate is 79.  He is satting 97% on 2  L nasal cannula.  HEENT:  Normal.  NECK:  Supple.  There is no JVD.  Carotids are 1+ bilaterally.  Question  of a soft right carotid bruit.  There is no lymphadenopathy or  thyromegaly.  CARDIAC:  PMI is mildly displaced laterally.  He has got a regular rate  and rhythm.  No obvious murmurs, rubs or gallops.  No S3.  LUNGS:  Clear.  ABDOMEN:  Obese, nontender, nondistended.  No hepatosplenomegaly, no  bruits.  No masses.  Good bowel sounds.  EXTREMITIES:  Warm with no cyanosis, clubbing or  edema.  Femoral pulses  are 2+ bilaterally.  There is no bruit on the right.  DP pulse is  1+ on  the right and 2+ on the left.  NEURO:  Alert and x3.  Cranial nerves II-XII are intact.  Moves all 4  extremities without difficulty.  Affect is pleasant.   EKG shows sinus rhythm at a rate of 75.  He has inferior Q-waves.  He  has decreased R-wave progression with persistent ST elevation in V4  through V6.  There is a prominent R-wave in V1, consistent with an  inferior posterolateral myocardial infarction.  Sodium is 134, potassium  4.6, creatinine 1.0, glucose 315.  White count 10.3, hemoglobin is 12.7,  platelets 256.  INR is 1.1.  CK total was 89 with an MB of 3.3, troponin  is 6.71.   ASSESSMENT:  1. Probable large, out of hospital, inferior posterolateral myocardial      infarction, question of dominant left circumflex occlusion.  2. Hypertension.  3. Diabetes.  4. Hyperlipidemia.  5. Question soft right carotid bruit.   PLAN/DISCUSSION:  We will admit him to step-down.  He will get an  echocardiogram and will plan for right and left heart cath tomorrow to  assess his anatomy.  I suspect the damage has been done in the infarct  territory, but will see if he has other areas  which may have high-grade  disease.  He will also need a carotid ultrasound.  We will start him on  ACE inhibitor, beta-blocker, aspirin, statin and sliding scale insulin.  We will hold his metformin.  We will also need a cardiac rehab referral.  Will not start Plavix now given the possibility of surgical disease  We  will stop his heparin and Integrilin, as he has been pain free for  several days.      Bevelyn Buckles. Bensimhon, MD  Electronically Signed     DRB/MEDQ  D:  06/09/2008  T:  06/10/2008  Job:  [id number]

## 2011-02-28 NOTE — Assessment & Plan Note (Signed)
[facility]                          [facility] OFFICE NOTE   NAME:Robert [name], Robert [name]                      MRN:          [id number]  DATE:08/10/2008                            DOB:          November 29, 1940    PRIMARY CARE PHYSICIAN:  Dr. Erasmo Downer.   REASON FOR VISIT:  Scheduled followup.   HISTORY OF PRESENT ILLNESS:  I saw Robert [name] back in early September.  He continues to do relatively well.  He states that he is walking  approximately 30-35 minutes around his property most days of the week  and is doing this without any significant angina or limiting shortness  of breath beyond NYHA class II.  He is tolerating his medical regimen,  although does seem to have some reflux symptoms.  We talked about an  over-the-counter antacid (not proton pump inhibitor) given his dual  antiplatelet therapy.  Recent lipids indicated aggressive control with  triglyceride level of 83, total cholesterol 77, HDL 22, and LDL 37 on  high-dose Crestor.  His liver function tests were normal.   ALLERGIES:  No known drug allergies.   PRESENT MEDICATIONS:  1. Aspirin 325 mg p.o. daily.  2. Plavix 75 mg p.o. daily.  3. Avodart 0.5 mg p.o. daily.  4. Crestor 40 mg p.o. nightly.  5. Lisinopril 10 mg p.o. daily.  6. Carvedilol 6.25 mg p.o. b.i.d.  7. Metformin 500 mg p.o. b.i.d.  8. Ambien 5 mg p.o. nightly p.r.n.  9. Nitroglycerin p.r.n.  10.Viagra p.r.n.   REVIEW OF SYSTEMS:  As per history of present illness.  Otherwise,  negative.   PHYSICAL EXAMINATION:  VITAL SIGNS:  Blood pressure is 118/80, heart  rate is 72, and weight is 201 pounds.  This is down from 203 pounds at  his last visit.  GENERAL:  The patient is comfortable and in no acute distress.  NECK:  No elevated jugular venous pressure.  No thyromegaly.  LUNGS:  Clear without labored breathing.  CARDIAC:  Regular rate and rhythm.  Soft systolic murmur at the base.  Normal second heart sound.  No S3,  gallop, or pericardial rub.  EXTREMITIES:  Exhibits no frank pitting edema.   IMPRESSION AND RECOMMENDATIONS:  1. Multivessel cardiovascular disease status post drug-eluting stent      placement to the left anterior descending and first obtuse marginal      in the setting of a late presentation inferoposterolateral      myocardial infarction with an ejection fraction of 50%.      Symptomatically, Robert [name] is stable.  We will plan to continue      medical therapy, and I will see him back in approximately 6 months.  2. Hyperlipidemia, as outlined above.  We did decrease Crestor to 20      mg p.o. q.h.s.  Followup lipid profile and liver function testing      over the next 8-12 weeks will be arranged.  3. Carotid artery disease, 60-70% on the left.  This will need      followup evaluation of the next year.  Jonelle Sidle, MD  Electronically Signed    SGM/MedQ  DD: 08/10/2008  DT: 08/10/2008  Job #: [id number]   cc:   Erasmo Downer, MD

## 2011-02-28 NOTE — Assessment & Plan Note (Signed)
OFFICE VISIT   [name], [name]  DOB:  1941/03/02                                       06/23/2010  [id number]#:[id number]   CHIEF COMPLAINT:  Carotid stenosis.   HISTORY OF PRESENT ILLNESS:  The patient is a 70 year old male referred  by Dr. Diona Browner for evaluation of asymptomatic carotid stenosis.  The  patient has had a known carotid stenosis for several years that has been  followed with serial ultrasounds.  He is referred for progression of his  stenosis.   The patient denies any symptoms of TIA, amaurosis or stroke.  Chronic  medical problems include coronary artery disease with previous coronary  stenting, diabetes, hypertension, and elevated cholesterol.  These are  all currently controlled and followed by Dr. Diona Browner.   SOCIAL HISTORY:  He is retired.  He is married.  He is a former smoker  but quit 20 years ago.  He consumes approximately one-fifth of liquor  per month.   FAMILY HISTORY:  Unremarkable.   REVIEW OF SYSTEMS:  A full 12-point review of systems was performed with  the patient today.  Please see intake referral form for details  regarding this.   PHYSICAL EXAMINATION:  Vital Signs:  Blood pressure is 160/79 in the  right arm, 162/79 in the left arm, respirations 20, heart rate 58 and  regular.  HEENT:  Unremarkable.  Neck:  2+ carotid pulses without bruit.  Chest:  Clear to auscultation.  Heart:  Regular rate and without murmur.  Abdomen:  Slightly obese, soft, nontender, nondistended.  No masses.  Extremities:  He has 2+ radial femoral, popliteal, dorsalis pedis and  posterior tibial pulses bilaterally.  Musculoskeletal:  No significant  major joint deformities.  Neurologic:  Symmetric upper extremity and  lower extremity motor strength which is 5/5.  Skin:  No open ulcers or  rashes.   I reviewed his carotid duplex exam from our lab today which shows a 40-  60% left internal carotid artery stenosis and a 20-40% right  internal  carotid artery stenosis.  This is compared to a duplex exam dated June 01, 2010 from [facility] which suggested a greater than 70% left internal  carotid artery stenosis and a 50-70% right internal carotid artery  stenosis.  Peak systolic velocity on the left side was 284 cm/sec.  The  right side was 187 cm/sec.  These velocities were significantly higher  than the velocities obtained on our exam today.   ASSESSMENT:  Bilateral moderate carotid stenosis.  I believe the best  option for the patient at this point would be to continue antiplatelet  therapy in the form of aspirin and Plavix which she is currently taking  as well as risk factor modification.  I believe to determine which of  the ultrasounds is correct we will need to obtain a CT angiogram which  will give [place] a more precise level of stenosis.  If the stenosis is still  less than 80% then we would most likely continue with risk factor  modification and continued surveillance.  However, if it is a high-grade  stenosis we would consider carotid endarterectomy.  He will follow up  next week after a CT angio.     Janetta Hora. Fields, MD  Electronically Signed   CEF/MEDQ  D:  06/23/2010  T:  06/24/2010  Job:  [id number]   cc:   Jonelle Sidle, MD

## 2011-02-28 NOTE — Cardiovascular Report (Signed)
NAME:  Robert [name], Robert [name] NO.:  [id number]   MEDICAL RECORD NO.:  [id number]          PATIENT TYPE:  OBV   LOCATION:  2918                         FACILITY:  [facility]   PHYSICIAN:  Rollene Rotunda, MD, FACCDATE OF BIRTH:  10-20-1940   DATE OF PROCEDURE:  06/10/2008  DATE OF DISCHARGE:                            CARDIAC CATHETERIZATION   PRIMARY CARE PHYSICIAN:  Erasmo Downer, MD   REASON FOR PRESENTATION:  The patient with chest pain and a myocardial  infarction, probable acute inferior with a delayed presentation.   PROCEDURE NOTE:  Left heart catheterization performed at the right  femoral artery.  The artery was cannulated using the anterior wall  puncture.  A #6-French arterial sheath was inserted via the modified  Seldinger technique.  Preformed Judkins and a pigtail catheter were  utilized.  The patient tolerated the procedure well and left the lab in  stable condition.   RESULTS:  Hemodynamics:  LV 99/2, AO 104/58.   Coronaries:  Left main was normal.  The LAD had long proximal  calcification.  This proximal focal 95% stenosis in the calcified  segment.  This was within a long segment of 25-30% stenosis.  The  remainder of the vessel was free of high-grade disease though there were  luminal irregularities.  The mid diagonal was a large vessel.  There was  proximal 30% stenosis.  There was mid long 70% stenosis.  Circumflex in  the AV groove had diffuse luminal irregularities and a long mid 25%  stenosis.  First obtuse marginal was a large vessel.  There was ostial  25% stenosis followed by 30% stenosis, followed by proximal 80-90%  stenosis.  The remainder of the vessel was free of high-grade disease.  There was a second obtuse marginal, which was occluded proximally after  a long subtotal stenosis.  There were collaterals from the right to this  vessel.  The right coronary artery is a large dominant vessel.  There is  a proximal long 25% followed by another  focal 25% stenosis.  There were  mid luminal irregularities.  There was distal 25% stenosis.  There was a  diffuse 50% stenosis before and a 60% stenosis after a small  posterolateral.  The PDA was a large vessel.  There was a mid 60%  stenosis.  There was a branching vessel.  Second branch had a 50%  stenosis.   Left ventriculogram:  The left ventriculogram was obtained in the RAO  projection.  The EF was 50% with anterior hypokinesis and inferoapical  akinesis.   CONCLUSION:  Severe two-vessel coronary artery disease.  The anatomy is  enlarged with the EKG.  Tight lesions are in the left anterior  descending and the circumflex marginal.  There is also the occluded  small marginal.   PLAN:  At this point, I will review this with Dr. Excell Seltzer to consider  angioplasty of both the LAD and the circumflex.  The LAD would be a  somewhat complex lesion as it is heavily calcified.  He will need  aggressive risk reduction.      Rollene Rotunda, MD,  [facility]  Electronically Signed     JH/MEDQ  D:  06/10/2008  T:  06/11/2008  Job:  [id number]   cc:   Erasmo Downer, MD

## 2011-02-28 NOTE — Assessment & Plan Note (Signed)
[facility]                          [facility] OFFICE NOTE   Robert, [name]                      MRN:          [id number]  DATE:06/24/2008                            DOB:          Nov 18, 1940    PRIMARY CARE PHYSICIAN:  Erasmo Downer, MD.   REASON FOR VISIT:  Post hospital followup.   HISTORY OF PRESENT ILLNESS:  [name] is a 70 year old male with a  history of type 2 diabetes mellitus, hypertension, hyperlipidemia, and  obesity as well as family history of cardiovascular disease.  He  presented in late August with evidence of an out-of-hospital late  presentation inferoposterolateral myocardial infarction.  He was taken  to the cardiac catheterization lab initially by Dr. Antoine Poche with  findings of multivessel disease including a 95% left anterior  descending, 30 and 70% diagonal, 90% first obtuse marginal, occluded  second obtuse marginal, and dominant right coronary artery with  scattered 25-50% stenoses with a 90% posterior descending branch, and  right-to-left collaterals with overall ejection fraction of 50%.  Dr.  Excell Seltzer reviewed the films and ultimately, the patient underwent  placement of a drug-eluting stent within the left anterior descending.  He did have a filling defect associated with in-stent thrombus and this  was treated with further intervention and subsequent observation on  Integrilin.  The first obtuse marginal was also stented with overlapping  drug-eluting stents.  The patient was seen in the hospital by a diabetes  educator and also cardiac rehabilitation.  His hemoglobin A1c was found  to be 8.5 and he is to follow up with Dr. Linna Darner for further adjustments  in his medical regimen.  He was off of metformin while in the hospital  in and around the time of his angiogram.  Medical therapy was titrated  including the addition of Coreg, lisinopril, and Crestor.  He follows up  today and stating that overall, he  has done fairly well.  He still has a  fairly minor left-sided chest pain at times, largely sporadic without  any clear precipitation with exertion.  He is not yet started back to  his walking regimen.  He has been trying to lose some weight through  diet.  Then, we talked about the importance of his medications.  I also  spoke with him about cardiac rehabilitation, although at this time, he  was not prepared to do this.   ALLERGIES:  No known drug allergies.   PRESENT MEDICATIONS:  1. Crestor 40 mg p.o. nightly.  2. Avodart 0.5 mg p.o. daily.  3. Plavix 75 mg p.o. daily.  4. Aspirin 325 mg p.o. daily.  5. Lisinopril 10 mg p.o. daily.  6. Carvedilol 6.25 mg p.o. daily.  7. Metformin 500 mg p.o. b.i.d.  8. Ambien 5 mg p.o. nightly p.r.n.  9. Nitroglycerin 0.4 mg p.r.n.   REVIEW OF SYSTEMS:  As per history of present illness, otherwise  negative.   PHYSICAL EXAMINATION:  VITAL SIGNS:  Blood pressure is 130/79, heart  rate is 66, and weight is 203 pounds.  GENERAL:  The patient is comfortable, in no acute  distress.  HEENT:  Conjunctivae normal.  Oropharynx is clear.  NECK:  Supple.  No elevated jugular venous pressure.  No loud carotid  bruits or thyromegaly.  LUNGS:  Clear with diminished breath sounds.  CARDIAC:  Regular rate and rhythm.  Soft systolic murmur preserved,  second heart sound.  No S3 gallop.  ABDOMEN:  Obese and nontender.  Normal bowel sounds.  EXTREMITIES:  Exhibit no frank pitting edema.  Distal pulses are 1 to  2+.  SKIN:  Warm and dry.  MUSCULOSKELETAL:  No kyphosis is noted.  NEUROPSYCHIATRIC:  The patient is alert and oriented x3.  Affect is  appropriate.   IMPRESSION AND RECOMMENDATIONS:  1. Multivessel cardiovascular disease as outlined above with an      ejection fraction of 50%, status post late presentation      inferoposterolateral myocardial infarction.  He is now status post      drug-eluting stent placement to the left anterior descending  and      first obtuse marginal and is doing relatively well symptomatically.      I encouraged him to continue with his present regimen.  I explained      to him the importance of dual antiplatelet therapy.  He will be on      this for at least a year.  In addition, I have asked him to start a      basic walking regimen and progress gradually.  We talked about      cardiac rehabilitation, but he would like to think about this for      the time being.  I will bring him back over the next 6 weeks to      review his progress.  2. Carotid artery disease documented by ultrasound during his recent      hospital stay.  He has moderate stenosis of 60-70% on the left.  No      significant right internal carotid artery stenosis was noted.  3. Hyperlipidemia, on Crestor.  He will be due for followup liver      function and lipid profile over the next 6 weeks.  Goal LDL should      be around 70.  4. Type 2 diabetes mellitus, followed by Dr. Linna Darner.  He is back on      metformin.     Jonelle Sidle, MD  Electronically Signed    SGM/MedQ  DD: 06/24/2008  DT: 06/25/2008  Job #: [id number]   cc:   Erasmo Downer, MD

## 2011-02-28 NOTE — Discharge Summary (Signed)
NAME:  Robert [name], SHELL NO.:  [id number]   MEDICAL RECORD NO.:  [id number]          PATIENT TYPE:  OBV   LOCATION:  2004                         FACILITY:  [facility]   PHYSICIAN:  Jonelle Sidle, MD DATE OF BIRTH:  06/14/1941   DATE OF ADMISSION:  06/09/2008  DATE OF DISCHARGE:  06/12/2008                               DISCHARGE SUMMARY   PROCEDURES:  1. Cardiac catheterization.  2. Coronary arteriogram.  3. Left ventriculogram.  4. Percutaneous transluminal coronary angioplasty with stenting and      intravascular ultrasound of the left anterior descending.  5. Percutaneous transluminal coronary angioplasty and stenting of the      obtuse marginal 1.   PRIMARY FINAL DISCHARGE DIAGNOSIS:  Non-ST-segment elevation myocardial  infarction.   SECONDARY DIAGNOSES:  1. Hypertension.  2. Diabetes with a hemoglobin A1c of 8.5.  3. Dyslipidemia with a total cholesterol of 122, triglycerides 131,      HDL 18, LDL 78.  4. Borderline obesity with a body mass index of 29.7.  5. Family history of coronary artery disease.   TIME OF DISCHARGE:  41 minutes.   HOSPITAL COURSE:  Robert [name] is a 70 year old male with no previous  history of coronary artery disease.  He had chest pain and went to  [facility].  His cardiac enzymes were positive and he was  transferred for further evaluation and treatment.   A cardiac catheterization was performed, which showed a 95% LAD,  diagonal 30% and 70%, OM1 90%, OM2 totaled, dominant RCA 25%, 50%, and  60%, PDA 90%, right-to-left collaterals seen, and a 50% EF.  Drug-  eluting stents were used to stent the LAD reducing the stenosis from 95%  to 0; however, he developed a filling defect with in-stent thrombus,  which was treated with repeat PTCA and Integrilin.  The OM1 was reduced  from 95% to 0 with overlapping drug-eluting stents, there was no reflow  at the conclusion, but it was treated with intracardiac adenosine with  improvement to 0% residual.  He is to be on aspirin and Plavix  indefinitely.   Robert [name] was seen by cardiac rehab.  He was seen by the diabetes  treatment recommendation and with a hemoglobin A1c of 8.5.  They  recommended Lantus, however, we will defer this to Dr. Linna Darner in [place] and  encourage Robert [name] to follow up with Dr. Linna Darner.  Crestor was added to  his medication regimen.   On June 12, 2008, Robert [name] was ambulating without chest pain or  shortness of breath.  His metformin is on hold.  His blood pressure  control medications at admission were atenolol and doxazosin, but  because of the MI, Coreg and lisinopril were felt to be more appropriate  and these were changed.  Robert [name]'s blood pressure was under good  control and he was ambulating without chest pain or shortness of breath.  Dr. Diona Browner saw him and considered him stable for discharge with close  outpatient followup.   DISCHARGE INSTRUCTIONS:  His activity level is to be increased  gradually.  He is  not to do any lifting for 3 weeks and no driving for 3  days.  He is encouraged to stick to a low-sodium diabetic diet.  He is  to call our office for problems with the cath site.  He is to follow up  with Dr. Diona Browner at [facility] on June 24, 2008, at 1:30 and with  Dr. Linna Darner as well.   DISCHARGE MEDICATIONS:  1. Avodart 0.5 mg daily.  2. Metformin 500 mg b.i.d., restart June 13, 2008.  3. Atenolol and doxazosin are discontinued.  4. Ambien 5 mg nightly p.r.n.  5. Aspirin 325 mg daily.  6. Plavix 75 mg a day.  7. Coreg 6.25 mg b.i.d.  8. Lisinopril 10 mg a day.  9. Nitroglycerin sublingual p.r.n.  10.Crestor 40 mg a day.      Theodore Demark, PA-C      Jonelle Sidle, MD  Electronically Signed    RB/MEDQ  D:  06/12/2008  T:  06/12/2008  Job:  [id number]   cc:   [facility]  Erasmo Downer, MD

## 2011-02-28 NOTE — Assessment & Plan Note (Signed)
OFFICE VISIT   Robert [name], Robert [name]  DOB:  03/07/1941                                       06/30/2010  CHART#:[id number]   The patient returns for followup today.  He was last seen on 06/23/2010.  He was sent for a CT angiogram to confirm a carotid duplex ultrasound.  He continues to deny any symptoms of TIA, amaurosis or stroke.  I  reviewed his CT angio of the neck today.  It shows approximately 50%  stenosis of the left internal carotid artery, 50% stenosis of the right  internal carotid artery, left is slightly worse than the right.  He also  had a 45% left vertebral artery stenosis.  All of these findings  correlate with the ultrasound in our lab versus the ultrasound in the  [facility] lab which suggested a 70% stenosis.   PHYSICAL EXAM:  Today blood pressure is 136/81 in the right arm, 146/76  in the left arm, oxygen saturation is 99% on room air, heart rate 62 and  regular.  Neurological:  Shows symmetric upper extremity and lower  extremity motor strength which is 5/5.   In summary, the patient has a carotid duplex exam from our lab which  suggests 50% stenosis rather than the 70% stenosis suggested by the  [facility] lab.  This was confirmed on a CT angiogram today which again  shows bilateral approximately 50% stenosis.  Since he is currently  asymptomatic I believe the best management for this currently would be  continued duplex surveillance ultrasound.  We will schedule him for a  repeat carotid duplex scan in six months' time and he will return sooner  if he has any symptoms.     Janetta Hora. Fields, MD  Electronically Signed   CEF/MEDQ  D:  06/30/2010  T:  07/01/2010  Job:  [id number]   cc:   Jonelle Sidle, MD

## 2011-02-28 NOTE — Procedures (Signed)
CAROTID DUPLEX EXAM   INDICATION:  Carotid stenosis.   HISTORY:  Diabetes:  Yes  Cardiac:  Yes  Hypertension:  Yes  Smoking:  No  Previous Surgery:  No  CV History:  Asymptomatic  Amaurosis Fugax Yes No, Paresthesias Yes No, Hemiparesis Yes No                                       RIGHT             LEFT  Brachial systolic pressure:  Brachial Doppler waveforms:  Vertebral direction of flow:        Antegrade         Antegrade  DUPLEX VELOCITIES (cm/sec)  CCA peak systolic                   80                90  ECA peak systolic                   124               100  ICA peak systolic                   105               142  ICA end diastolic                   39                45  PLAQUE MORPHOLOGY:                  Heterogenous      Heterogenous  PLAQUE AMOUNT:                      Mild/moderate     Moderate  PLAQUE LOCATION:                    Bifurcation       Bifurcation   IMPRESSION:  1. Right internal carotid artery velocities suggestive of 20% to 39%      stenosis.  2. Left internal carotid artery velocities suggest a 40% to 59%      stenosis.        ___________________________________________  Janetta Hora Fields, MD   EM/MEDQ  D:  06/23/2010  T:  06/23/2010  Job:  [id number]

## 2011-02-28 NOTE — Cardiovascular Report (Signed)
NAME:  TELLER, WAKEFIELD NO.:  [id number]   MEDICAL RECORD NO.:  [id number]          PATIENT TYPE:  OBV   LOCATION:  2918                         FACILITY:  [facility]   PHYSICIAN:  Veverly Fells. Excell Seltzer, MD  DATE OF BIRTH:  May 01, 1941   DATE OF PROCEDURE:  06/10/2008  DATE OF DISCHARGE:                            CARDIAC CATHETERIZATION   PROCEDURE:  Percutaneous transluminal coronary angioplasty, stenting and  intravascular ultrasound of the LAD.  Percutaneous transluminal coronary  angioplasty and stenting of the first OM branch of  the left circumflex.   Mr. Vanhise is a 70 year old gentleman who underwent diagnostic right  and left heart catheterization earlier today by Dr. Antoine Poche.  He  presented with an out of hospital inferoposterior MI.  His diagnostic  coronary angiogram demonstrated distal vessel stenosis of the right  coronary artery, severe diffuse stenosis of a large OM branch, and  severe calcific stenosis of the proximal LAD.  The mid AV groove  circumflex was occluded and collateralized from the right coronary  artery.  After reviewing his films, we elected to proceed with two-  vessel PCI of the proximal LAD and OM1 branch of the left circumflex.  The patient's right coronary artery was widely patent with distal small  vessel disease which was not favorable for revascularization.   Risks and indications of the procedure were reviewed with the patient.  Informed consent was obtained.  There were indwelling sheaths in the  right femoral artery and right femoral vein from diagnostic  catheterization.  This area was prepped and draped.  Using normal  sterile technique, both sheaths were changed out.  A 7-French venous  sheath was placed and the 6-French arterial sheath was placed.  An XBLAD  6-French guide catheter was used.  Angiomax was used for  anticoagulation.  The patient was preloaded with 600 mg of Plavix.  Once  a therapeutic ACT was achieved, a  cougar guidewire was passed across the  lesion in the proximal LAD and advanced to the distal LAD.  Because of  the right heavy calcification of this proximal LAD lesion, I elected to  predilate it with a 2 x 10 mm AngioSculpt scoring balloon.  The first  inflation was performed up to 14 atmospheres and the second inflation  was taken to 18 atmospheres.  The extent of balloon expansion was  difficult to visualize.  Therefore I elected to pretreat the lesion  again with a 2.5 x 12 mm Voyager [place] balloon.  Initially this balloon was  taken to 16 atmospheres and a subsequent inflation to 18 atmospheres was  performed.  This clearly improved the lumen of the vessel and the  balloon was well expanded.  Following predilatation, a 2.75 x 15 mm  Promus drug-eluting stent was carefully positioned and deployed at 16  atmospheres.  The stent was then postdilated with a 3 x 12 mm Voyager [place]  balloon which was taken to 18 atmospheres.  At the completion of the  intervention, the stent was well expanded and there was TIMI-3 flow in  the vessel.  Just beyond the distal  edge of the stent, there was another  area of  moderate diffuse stenosis with what appeared to be an adequate  lumen by angiography.   At that point, attention was turned to the left circumflex.  The  guidewire was pulled back and advanced across an area of severe diffuse  stenosis in the first OM branch.  It was advanced out to the distal OM  branch.  The vessel was then predilated with a 2 x 20 mm Voyager balloon  which was taken to 8 atmospheres for a single inflation.  The balloon  was used to assess the length of the lesion.  The lesion was very long  and I suspected 2 stents would be necessary.  At a 2.5 x 28 mm Promus  stent was positioned near the origin of the OM branch and was deployed  at 12 atmospheres.  Following stenting there was significant residual  stenosis off the distal end.  There was also TIMI-2 flow in the vessel.   I suspected a possible distal edge dissection and elected to treat that  area with a 2.5 x 12 mm Promus stent in overlapping fashion off the  distal edge of the initial stent.  The stent was deployed a 10  atmospheres.  Following placement of the second stent, the entire  stented segment was postdilated with the original 2.5 x 12 mm Voyager  balloon which was taken to 16 atmospheres on 3 inflations.  Angiography  demonstrated widely expanded stent.  There was still a mild reduction in  flow and I elected to treat the vessel with adenosine.  Multiple doses  of intracoronary adenosine were given.  Final angiography demonstrated a  good result on the circumflex with improvement in inflow.  The patient  remained asymptomatic throughout.   At the completion of the circumflex intervention, it was noted that the  LAD stent had a very hazy appearance in the midportion and I was  suspicious of acute thrombus.  I elected to start Integrilin with a  double bolus and drip.  The LAD was rewired and intravascular ultrasound  was performed.  This confirmed eccentric thrombus within the stent.  Both stent edges appeared well opposed without evidence of edge  dissection.  I then redilated the stent with a 3 x 12 mm noncompliant  balloon on a single inflation to 16 atmospheres.  Final angiography  demonstrated resolution of the filling defect and again showed a well  expanded stent.  At that point, the guidewire and catheter were removed  and the patient was transferred to the holding area in stable condition.   FINAL CONCLUSIONS:  1. Successful percutaneous coronary intervention of the proximal left      anterior descending using a single Promus drug-eluting stent.      Notation of post stent filling defect as detailed above assessed      with intravascular ultrasound and treated with Integrilin and      repeat post dilatation.  2. Successful stenting of the second obtuse marginal branch of the       left circumflex.   RECOMMENDATIONS:  Mr. Reichardt will be continued on Integrilin for 24  hours.  He will continue with aspirin and Plavix.  He will require  aggressive risk reduction.      Veverly Fells. Excell Seltzer, MD  Electronically Signed     MDC/MEDQ  D:  06/11/2008  T:  06/11/2008  Job:  [id number]

## 2011-06-12 ENCOUNTER — Encounter: Payer: Self-pay | Admitting: *Deleted

## 2011-06-12 ENCOUNTER — Encounter: Payer: Self-pay | Admitting: Cardiology

## 2011-06-13 ENCOUNTER — Encounter: Payer: Self-pay | Admitting: Cardiology

## 2011-06-13 ENCOUNTER — Ambulatory Visit (INDEPENDENT_AMBULATORY_CARE_PROVIDER_SITE_OTHER): Payer: BC Managed Care – PPO | Admitting: Cardiology

## 2011-06-13 VITALS — BP 126/78 | HR 54 | Ht 69.0 in | Wt 209.0 lb

## 2011-06-13 DIAGNOSIS — E785 Hyperlipidemia, unspecified: Secondary | ICD-10-CM

## 2011-06-13 DIAGNOSIS — I6529 Occlusion and stenosis of unspecified carotid artery: Secondary | ICD-10-CM

## 2011-06-13 DIAGNOSIS — I10 Essential (primary) hypertension: Secondary | ICD-10-CM

## 2011-06-13 DIAGNOSIS — I2510 Atherosclerotic heart disease of native coronary artery without angina pectoris: Secondary | ICD-10-CM

## 2011-06-13 MED ORDER — CLOPIDOGREL BISULFATE 75 MG PO TABS: 75.0000 mg | ORAL_TABLET | Freq: Every day | ORAL | Status: DC

## 2011-06-13 MED ORDER — CARVEDILOL 6.25 MG PO TABS: 6.2500 mg | ORAL_TABLET | Freq: Two times a day (BID) | ORAL | Status: DC

## 2011-06-13 NOTE — Assessment & Plan Note (Signed)
We discussed diet and sodium restriction. Weight loss also indicated.

## 2011-06-13 NOTE — Patient Instructions (Signed)
Your physician wants you to follow-up in: 6 months. You will receive a reminder letter in the mail one-two months in advance. If you don't receive a letter, please call our office to schedule the follow-up appointment. Your physician recommends that you continue on your current medications as directed. Please refer to the Current Medication list given to you today. The refills you requested were sent today.

## 2011-06-13 NOTE — Assessment & Plan Note (Signed)
Moderate, assessed by Dr. Darrick Penna.

## 2011-06-13 NOTE — Assessment & Plan Note (Signed)
Due for followup lipid panel with Dr. Olena Leatherwood.

## 2011-06-13 NOTE — Assessment & Plan Note (Signed)
Symptomatically stable on medical therapy. He continues on dual antiplatelet therapy, no major bleeding problems. ECG reviewed. No changes made today. Continue observation. We did discuss diet and exercise.

## 2011-06-13 NOTE — Progress Notes (Signed)
Clinical Summary Robert [name] is a 70 y.o.male presenting for followup. He was seen back in February.  He reports no active angina, stable shortness of breath with activity. He indicates compliance with his medications. Does not adhere to any particular diet, stating that he "eats whatever I want."  He is due for a followup lipid profile, plans to see Dr. Olena Leatherwood in 2 weeks.  ECG is reviewed showing chronic changes.  No Known Allergies  Medication list reviewed.  Past Medical History  Diagnosis Date  . Type 2 diabetes mellitus   . Mixed hyperlipidemia   . Essential hypertension, benign   . Carotid artery disease     50%, bilateral  by CTA 2011  . Coronary atherosclerosis of native coronary artery     DES to LAD/OM Aug 2009  . Myocardial infarction 05/2008    Out of hospital late presentation inferoposterolateral myocardial infarction  . Sinus bradycardia     History reviewed. No pertinent past surgical history.  Family History  Problem Relation Age of Onset  . Coronary artery disease      Social History Robert [name] reports that he quit smoking about 27 years ago. His smoking use included Cigarettes. He has a 40 pack-year smoking history. He has quit using smokeless tobacco. His smokeless tobacco use included Chew. Robert [name] reports that he does not drink alcohol.  Review of Systems No palpitations or syncope.  Physical Examination Filed Vitals:   06/13/11 0854  BP: 126/78  Pulse: 54  Obese male in no acute distress. HEENT: Conjunctiva and lids normal, oropharynx with moist mucosa. Neck: Supple, soft carotid bruits, no thyromegaly. Lungs: Diminished breath sounds, nonlabored. Cardiac: Regular rate and rhythm, no significant murmur or gallop. Abdomen: Soft, nontender, bowel sounds present. Skin: Warm and dry. Musculoskeletal: No kyphosis. Extremities: No pitting edema, distal pulses one plus. Neuropsychiatric: Alert and oriented x3, affect  appropriate.   ECG Reviewed in EMR.   Problem List and Plan

## 2011-06-21 ENCOUNTER — Other Ambulatory Visit: Payer: Self-pay | Admitting: *Deleted

## 2011-06-21 MED ORDER — ROSUVASTATIN CALCIUM 10 MG PO TABS: 5.0000 mg | ORAL_TABLET | Freq: Every day | ORAL | Status: DC

## 2011-12-14 ENCOUNTER — Other Ambulatory Visit: Payer: Self-pay

## 2011-12-14 MED ORDER — CARVEDILOL 6.25 MG PO TABS: 6.2500 mg | ORAL_TABLET | Freq: Two times a day (BID) | ORAL | Status: DC

## 2011-12-14 MED ORDER — ROSUVASTATIN CALCIUM 10 MG PO TABS: 5.0000 mg | ORAL_TABLET | Freq: Every day | ORAL | Status: DC

## 2011-12-14 MED ORDER — CLOPIDOGREL BISULFATE 75 MG PO TABS: 75.0000 mg | ORAL_TABLET | Freq: Every day | ORAL | Status: DC

## 2011-12-15 ENCOUNTER — Other Ambulatory Visit: Payer: Self-pay | Admitting: *Deleted

## 2011-12-15 MED ORDER — CARVEDILOL 6.25 MG PO TABS: 6.2500 mg | ORAL_TABLET | Freq: Two times a day (BID) | ORAL | Status: DC

## 2011-12-15 MED ORDER — CLOPIDOGREL BISULFATE 75 MG PO TABS: 75.0000 mg | ORAL_TABLET | Freq: Every day | ORAL | Status: DC

## 2011-12-15 MED ORDER — ROSUVASTATIN CALCIUM 10 MG PO TABS: 5.0000 mg | ORAL_TABLET | Freq: Every day | ORAL | Status: DC

## 2012-01-05 ENCOUNTER — Ambulatory Visit (INDEPENDENT_AMBULATORY_CARE_PROVIDER_SITE_OTHER): Payer: BC Managed Care – PPO | Admitting: *Deleted

## 2012-01-05 ENCOUNTER — Other Ambulatory Visit: Payer: BC Managed Care – PPO

## 2012-01-05 DIAGNOSIS — I6529 Occlusion and stenosis of unspecified carotid artery: Secondary | ICD-10-CM

## 2012-01-15 ENCOUNTER — Other Ambulatory Visit: Payer: Self-pay | Admitting: *Deleted

## 2012-01-15 DIAGNOSIS — E785 Hyperlipidemia, unspecified: Secondary | ICD-10-CM | POA: Diagnosis not present

## 2012-01-15 DIAGNOSIS — I6529 Occlusion and stenosis of unspecified carotid artery: Secondary | ICD-10-CM

## 2012-01-15 DIAGNOSIS — N400 Enlarged prostate without lower urinary tract symptoms: Secondary | ICD-10-CM | POA: Diagnosis not present

## 2012-01-16 ENCOUNTER — Encounter: Payer: Self-pay | Admitting: Vascular Surgery

## 2012-01-16 NOTE — Procedures (Unsigned)
CAROTID DUPLEX EXAM  INDICATION:  Followup carotid disease  HISTORY: Diabetes:  Yes Cardiac:  Yes Hypertension:  Yes Smoking:  Previous Previous Surgery:  No CV History: Amaurosis Fugax No, Paresthesias No, Hemiparesis No                                      RIGHT             LEFT Brachial systolic pressure:         142               142 Brachial Doppler waveforms:         WNL               WNL Vertebral direction of flow:        Antegrade         Antegrade DUPLEX VELOCITIES (cm/sec) CCA peak systolic                   68                99 ECA peak systolic                   106               103 ICA peak systolic                   69                124 ICA end diastolic                   21                40 PLAQUE MORPHOLOGY:                  Heterogeneous     Heterogeneous PLAQUE AMOUNT:                      Minimal           Mild PLAQUE LOCATION:                    ICA               ICA  IMPRESSION: 1. 1%-39% right internal carotid artery plaquing. 2. 40%-59% left internal carotid artery stenosis. 3. Bilateral vertebral arteries are within normal limits.  ___________________________________________ Janetta Hora Fields, MD  LT/MEDQ  D:  01/05/2012  T:  01/05/2012  Job:  [id number]

## 2012-05-20 DIAGNOSIS — I10 Essential (primary) hypertension: Secondary | ICD-10-CM | POA: Diagnosis not present

## 2012-06-27 ENCOUNTER — Other Ambulatory Visit: Payer: Self-pay | Admitting: Cardiology

## 2012-07-01 ENCOUNTER — Other Ambulatory Visit: Payer: Self-pay | Admitting: Cardiology

## 2012-07-22 ENCOUNTER — Other Ambulatory Visit: Payer: Self-pay | Admitting: Cardiology

## 2012-08-20 ENCOUNTER — Ambulatory Visit (INDEPENDENT_AMBULATORY_CARE_PROVIDER_SITE_OTHER): Payer: Medicare Other | Admitting: Cardiology

## 2012-08-20 ENCOUNTER — Encounter: Payer: Self-pay | Admitting: Cardiology

## 2012-08-20 VITALS — BP 137/76 | HR 54 | Ht 69.0 in | Wt 205.0 lb

## 2012-08-20 DIAGNOSIS — E785 Hyperlipidemia, unspecified: Secondary | ICD-10-CM | POA: Diagnosis not present

## 2012-08-20 DIAGNOSIS — I10 Essential (primary) hypertension: Secondary | ICD-10-CM | POA: Diagnosis not present

## 2012-08-20 DIAGNOSIS — I2510 Atherosclerotic heart disease of native coronary artery without angina pectoris: Secondary | ICD-10-CM

## 2012-08-20 DIAGNOSIS — I6529 Occlusion and stenosis of unspecified carotid artery: Secondary | ICD-10-CM

## 2012-08-20 MED ORDER — NITROGLYCERIN 0.4 MG SL SUBL: 0.4000 mg | SUBLINGUAL_TABLET | SUBLINGUAL | Status: DC | PRN

## 2012-08-20 MED ORDER — CLOPIDOGREL BISULFATE 75 MG PO TABS: 75.0000 mg | ORAL_TABLET | Freq: Every day | ORAL | Status: DC

## 2012-08-20 MED ORDER — CARVEDILOL 6.25 MG PO TABS: 6.2500 mg | ORAL_TABLET | Freq: Two times a day (BID) | ORAL | Status: DC

## 2012-08-20 MED ORDER — ROSUVASTATIN CALCIUM 10 MG PO TABS: 5.0000 mg | ORAL_TABLET | Freq: Every day | ORAL | Status: DC

## 2012-08-20 MED ORDER — LOSARTAN POTASSIUM 100 MG PO TABS: 100.0000 mg | ORAL_TABLET | Freq: Every day | ORAL | Status: DC

## 2012-08-20 MED ORDER — HYDROCHLOROTHIAZIDE 25 MG PO TABS: 25.0000 mg | ORAL_TABLET | Freq: Every day | ORAL | Status: AC

## 2012-08-20 NOTE — Assessment & Plan Note (Signed)
Carotid Dopplers reviewed from March of this year. Followed by Dr. Darrick Penna.

## 2012-08-20 NOTE — Patient Instructions (Addendum)

## 2012-08-20 NOTE — Assessment & Plan Note (Signed)
Followed by Dr. Olena Leatherwood. This has been fairly well controlled over time on Crestor.

## 2012-08-20 NOTE — Assessment & Plan Note (Signed)
No change to current regimen. 

## 2012-08-20 NOTE — Progress Notes (Signed)
Clinical Summary Robert [name] is a 71 y.o.male presenting for followup. He was seen in August 2012. He reports no nitroglycerin use, no obvious angina or progressive shortness of breath. ECG was reviewed today showing sinus rhythm with old inferoposterior infarct pattern.  Carotid Dopplers in March of this year revealed 1-39% RICA stenosis and 40-59% LICA stenosis, followed by Dr. Darrick [name].  He reports compliance with his medications and has had followup lab work with Dr. Olena [name], has had good lipid control over time. Still working on getting better glucose control. We discussed his diet and walking regimen today.  He reports no bleeding problems on DAPT.  No Known Allergies  Current Outpatient Prescriptions  Medication Sig Dispense Refill  . aspirin 81 MG tablet Take 81 mg by mouth daily.        . carvedilol (COREG) 6.25 MG tablet Take 1 tablet (6.25 mg total) by mouth 2 (two) times daily with a meal.  180 tablet  3  . clopidogrel (PLAVIX) 75 MG tablet Take 1 tablet (75 mg total) by mouth daily.  90 tablet  3  . dutasteride (AVODART) 0.5 MG capsule Take 0.5 mg by mouth daily.        Marland Kitchen glipiZIDE (GLUCOTROL) 5 MG tablet Take 5 mg by mouth 2 (two) times daily.        . hydrochlorothiazide (HYDRODIURIL) 25 MG tablet Take 1 tablet (25 mg total) by mouth daily.  90 tablet  3  . loratadine (CLARITIN) 10 MG tablet Take 10 mg by mouth daily.        Marland Kitchen losartan (COZAAR) 100 MG tablet Take 1 tablet (100 mg total) by mouth daily.  90 tablet  3  . metFORMIN (GLUCOPHAGE) 500 MG tablet Take 500 mg by mouth 2 (two) times daily.        . nitroGLYCERIN (NITROSTAT) 0.4 MG SL tablet Place 1 tablet (0.4 mg total) under the tongue every 5 (five) minutes as needed for chest pain. If no relief after 3rd dose, proceed to ED  25 tablet  3  . rosuvastatin (CRESTOR) 10 MG tablet Take 0.5 tablets (5 mg total) by mouth daily.  45 tablet  3  . sildenafil (VIAGRA) 100 MG tablet Take 100 mg by mouth as directed.        .  zolpidem (AMBIEN) 5 MG tablet Take 5 mg by mouth daily as needed.        . [DISCONTINUED] carvedilol (COREG) 6.25 MG tablet TAKE ONE TABLET BY MOUTH TWICE DAILY WITH FOOD  180 tablet  0  . [DISCONTINUED] CRESTOR 10 MG tablet TAKE ONE-HALF TABLET BY MOUTH AT BEDTIME  45 tablet  0  . [DISCONTINUED] hydrochlorothiazide 25 MG tablet Take 25 mg by mouth daily.        . [DISCONTINUED] losartan (COZAAR) 100 MG tablet Take 100 mg by mouth daily.        . [DISCONTINUED] nitroGLYCERIN (NITROSTAT) 0.4 MG SL tablet Place 0.4 mg under the tongue as directed.          Past Medical History  Diagnosis Date  . Type 2 diabetes mellitus   . Mixed hyperlipidemia   . Essential hypertension, benign   . Carotid artery disease     50%, bilateral  by CTA 2011  . Coronary atherosclerosis of native coronary artery     DES to LAD/OM Aug 2009  . Myocardial infarction 05/2008    Out of hospital late presentation inferoposterolateral myocardial infarction  . Sinus bradycardia  Social History Robert [name] reports that he quit smoking about 28 years ago. His smoking use included Cigarettes. He has a 40 pack-year smoking history. He has quit using smokeless tobacco. His smokeless tobacco use included Chew. Robert [name] reports that he does not drink alcohol.  Review of Systems No palpitations, speech deficits, focal motor weakness. No claudication. Otherwise negative.  Physical Examination Filed Vitals:   08/20/12 0815  BP: 137/76  Pulse: 54   Filed Weights   08/20/12 0815  Weight: 205 lb (92.987 kg)   No acute distress. HEENT: Conjunctiva and lids normal, oropharynx with moist mucosa.  Neck: Supple, soft carotid bruits, no thyromegaly.  Lungs: Diminished breath sounds, nonlabored.  Cardiac: Regular rate and rhythm, no significant murmur or gallop.  Abdomen: Soft, nontender, bowel sounds present.  Skin: Warm and dry.  Musculoskeletal: No kyphosis.  Extremities: No pitting edema, distal pulses one plus.   Neuropsychiatric: Alert and oriented x3, affect appropriate.  Problem List and Plan   CORONARY ATHEROSCLEROSIS NATIVE CORONARY ARTERY Symptomatically stable on medical therapy, prior intervention was in August 2009. He continues to prefer observation at this time. We discussed warning signs. ECG is stable. Annual followup arranged, sooner if needed. Refills updated on nitroglycerin.  CAROTID STENOSIS Carotid Dopplers reviewed from March of this year. Followed by Dr. Darrick [name].  DYSLIPIDEMIA Followed by Dr. Olena [name]. This has been fairly well controlled over time on Crestor.  Essential hypertension, benign No change to current regimen.    Jonelle Sidle, M.D., F.A.C.C.

## 2012-08-20 NOTE — Assessment & Plan Note (Signed)
Symptomatically stable on medical therapy, prior intervention was in August 2009. He continues to prefer observation at this time. We discussed warning signs. ECG is stable. Annual followup arranged, sooner if needed. Refills updated on nitroglycerin.

## 2012-09-16 DIAGNOSIS — J984 Other disorders of lung: Secondary | ICD-10-CM | POA: Diagnosis not present

## 2012-09-16 DIAGNOSIS — J209 Acute bronchitis, unspecified: Secondary | ICD-10-CM | POA: Diagnosis not present

## 2012-09-16 DIAGNOSIS — R059 Cough, unspecified: Secondary | ICD-10-CM | POA: Diagnosis not present

## 2012-09-16 DIAGNOSIS — Z13220 Encounter for screening for lipoid disorders: Secondary | ICD-10-CM | POA: Diagnosis not present

## 2012-09-24 DIAGNOSIS — R911 Solitary pulmonary nodule: Secondary | ICD-10-CM | POA: Diagnosis not present

## 2012-09-25 DIAGNOSIS — J8410 Pulmonary fibrosis, unspecified: Secondary | ICD-10-CM | POA: Diagnosis not present

## 2012-09-25 DIAGNOSIS — R911 Solitary pulmonary nodule: Secondary | ICD-10-CM | POA: Diagnosis not present

## 2012-12-16 DIAGNOSIS — I10 Essential (primary) hypertension: Secondary | ICD-10-CM | POA: Diagnosis not present

## 2012-12-26 ENCOUNTER — Other Ambulatory Visit (INDEPENDENT_AMBULATORY_CARE_PROVIDER_SITE_OTHER): Payer: Medicare Other | Admitting: *Deleted

## 2012-12-26 ENCOUNTER — Ambulatory Visit: Payer: BC Managed Care – PPO | Admitting: Neurosurgery

## 2012-12-26 DIAGNOSIS — I6529 Occlusion and stenosis of unspecified carotid artery: Secondary | ICD-10-CM

## 2012-12-30 ENCOUNTER — Other Ambulatory Visit: Payer: Self-pay

## 2013-01-01 ENCOUNTER — Encounter: Payer: Self-pay | Admitting: Vascular Surgery

## 2013-01-13 DIAGNOSIS — C44320 Squamous cell carcinoma of skin of unspecified parts of face: Secondary | ICD-10-CM | POA: Diagnosis not present

## 2013-01-13 DIAGNOSIS — D044 Carcinoma in situ of skin of scalp and neck: Secondary | ICD-10-CM | POA: Diagnosis not present

## 2013-01-13 DIAGNOSIS — Z85828 Personal history of other malignant neoplasm of skin: Secondary | ICD-10-CM | POA: Diagnosis not present

## 2013-01-13 DIAGNOSIS — D485 Neoplasm of uncertain behavior of skin: Secondary | ICD-10-CM | POA: Diagnosis not present

## 2013-01-13 DIAGNOSIS — L570 Actinic keratosis: Secondary | ICD-10-CM | POA: Diagnosis not present

## 2013-01-13 DIAGNOSIS — D0460 Carcinoma in situ of skin of unspecified upper limb, including shoulder: Secondary | ICD-10-CM | POA: Diagnosis not present

## 2013-01-13 DIAGNOSIS — D0439 Carcinoma in situ of skin of other parts of face: Secondary | ICD-10-CM | POA: Diagnosis not present

## 2013-02-06 DIAGNOSIS — D044 Carcinoma in situ of skin of scalp and neck: Secondary | ICD-10-CM | POA: Diagnosis not present

## 2013-02-06 DIAGNOSIS — C44621 Squamous cell carcinoma of skin of unspecified upper limb, including shoulder: Secondary | ICD-10-CM | POA: Diagnosis not present

## 2013-02-06 DIAGNOSIS — C4442 Squamous cell carcinoma of skin of scalp and neck: Secondary | ICD-10-CM | POA: Diagnosis not present

## 2013-02-20 DIAGNOSIS — C44320 Squamous cell carcinoma of skin of unspecified parts of face: Secondary | ICD-10-CM | POA: Diagnosis not present

## 2013-02-20 DIAGNOSIS — C4442 Squamous cell carcinoma of skin of scalp and neck: Secondary | ICD-10-CM | POA: Diagnosis not present

## 2013-02-20 DIAGNOSIS — L570 Actinic keratosis: Secondary | ICD-10-CM | POA: Diagnosis not present

## 2013-03-06 DIAGNOSIS — C44621 Squamous cell carcinoma of skin of unspecified upper limb, including shoulder: Secondary | ICD-10-CM | POA: Diagnosis not present

## 2013-03-17 DIAGNOSIS — Z9181 History of falling: Secondary | ICD-10-CM | POA: Diagnosis not present

## 2013-03-17 DIAGNOSIS — Z823 Family history of stroke: Secondary | ICD-10-CM | POA: Diagnosis not present

## 2013-03-17 DIAGNOSIS — E785 Hyperlipidemia, unspecified: Secondary | ICD-10-CM | POA: Diagnosis present

## 2013-03-17 DIAGNOSIS — Z79899 Other long term (current) drug therapy: Secondary | ICD-10-CM | POA: Diagnosis not present

## 2013-03-17 DIAGNOSIS — D6959 Other secondary thrombocytopenia: Secondary | ICD-10-CM | POA: Diagnosis present

## 2013-03-17 DIAGNOSIS — Z807 Family history of other malignant neoplasms of lymphoid, hematopoietic and related tissues: Secondary | ICD-10-CM | POA: Diagnosis not present

## 2013-03-17 DIAGNOSIS — E872 Acidosis, unspecified: Secondary | ICD-10-CM | POA: Diagnosis not present

## 2013-03-17 DIAGNOSIS — N170 Acute kidney failure with tubular necrosis: Secondary | ICD-10-CM | POA: Diagnosis not present

## 2013-03-17 DIAGNOSIS — N179 Acute kidney failure, unspecified: Secondary | ICD-10-CM | POA: Diagnosis not present

## 2013-03-17 DIAGNOSIS — I951 Orthostatic hypotension: Secondary | ICD-10-CM | POA: Diagnosis not present

## 2013-03-17 DIAGNOSIS — N182 Chronic kidney disease, stage 2 (mild): Secondary | ICD-10-CM | POA: Diagnosis present

## 2013-03-17 DIAGNOSIS — E8809 Other disorders of plasma-protein metabolism, not elsewhere classified: Secondary | ICD-10-CM | POA: Diagnosis present

## 2013-03-17 DIAGNOSIS — I129 Hypertensive chronic kidney disease with stage 1 through stage 4 chronic kidney disease, or unspecified chronic kidney disease: Secondary | ICD-10-CM | POA: Diagnosis present

## 2013-03-17 DIAGNOSIS — G589 Mononeuropathy, unspecified: Secondary | ICD-10-CM | POA: Diagnosis present

## 2013-03-17 DIAGNOSIS — E1129 Type 2 diabetes mellitus with other diabetic kidney complication: Secondary | ICD-10-CM | POA: Diagnosis not present

## 2013-03-17 DIAGNOSIS — A419 Sepsis, unspecified organism: Secondary | ICD-10-CM | POA: Diagnosis not present

## 2013-03-17 DIAGNOSIS — I2510 Atherosclerotic heart disease of native coronary artery without angina pectoris: Secondary | ICD-10-CM | POA: Diagnosis not present

## 2013-03-17 DIAGNOSIS — D649 Anemia, unspecified: Secondary | ICD-10-CM | POA: Diagnosis not present

## 2013-03-17 DIAGNOSIS — N189 Chronic kidney disease, unspecified: Secondary | ICD-10-CM | POA: Diagnosis not present

## 2013-03-17 DIAGNOSIS — Z7982 Long term (current) use of aspirin: Secondary | ICD-10-CM | POA: Diagnosis not present

## 2013-03-17 DIAGNOSIS — R809 Proteinuria, unspecified: Secondary | ICD-10-CM | POA: Diagnosis not present

## 2013-03-17 DIAGNOSIS — N390 Urinary tract infection, site not specified: Secondary | ICD-10-CM | POA: Diagnosis not present

## 2013-03-17 DIAGNOSIS — N400 Enlarged prostate without lower urinary tract symptoms: Secondary | ICD-10-CM | POA: Diagnosis present

## 2013-03-17 DIAGNOSIS — E861 Hypovolemia: Secondary | ICD-10-CM | POA: Diagnosis present

## 2013-03-17 DIAGNOSIS — E871 Hypo-osmolality and hyponatremia: Secondary | ICD-10-CM | POA: Diagnosis not present

## 2013-03-17 DIAGNOSIS — S61409A Unspecified open wound of unspecified hand, initial encounter: Secondary | ICD-10-CM | POA: Diagnosis present

## 2013-03-19 ENCOUNTER — Encounter: Payer: Self-pay | Admitting: Cardiology

## 2013-03-19 DIAGNOSIS — I2510 Atherosclerotic heart disease of native coronary artery without angina pectoris: Secondary | ICD-10-CM

## 2013-03-31 DIAGNOSIS — A414 Sepsis due to anaerobes: Secondary | ICD-10-CM | POA: Diagnosis not present

## 2013-05-08 DIAGNOSIS — I10 Essential (primary) hypertension: Secondary | ICD-10-CM | POA: Diagnosis not present

## 2013-05-16 DIAGNOSIS — W57XXXA Bitten or stung by nonvenomous insect and other nonvenomous arthropods, initial encounter: Secondary | ICD-10-CM

## 2013-05-16 HISTORY — DX: Bitten or stung by nonvenomous insect and other nonvenomous arthropods, initial encounter: W57.XXXA

## 2013-07-16 DIAGNOSIS — Z85828 Personal history of other malignant neoplasm of skin: Secondary | ICD-10-CM | POA: Diagnosis not present

## 2013-07-16 DIAGNOSIS — D0439 Carcinoma in situ of skin of other parts of face: Secondary | ICD-10-CM | POA: Diagnosis not present

## 2013-07-16 DIAGNOSIS — L570 Actinic keratosis: Secondary | ICD-10-CM | POA: Diagnosis not present

## 2013-07-16 DIAGNOSIS — D485 Neoplasm of uncertain behavior of skin: Secondary | ICD-10-CM | POA: Diagnosis not present

## 2013-07-31 DIAGNOSIS — C44320 Squamous cell carcinoma of skin of unspecified parts of face: Secondary | ICD-10-CM | POA: Diagnosis not present

## 2013-07-31 DIAGNOSIS — L570 Actinic keratosis: Secondary | ICD-10-CM | POA: Diagnosis not present

## 2013-08-11 DIAGNOSIS — I10 Essential (primary) hypertension: Secondary | ICD-10-CM | POA: Diagnosis not present

## 2013-08-19 ENCOUNTER — Encounter: Payer: Self-pay | Admitting: Cardiology

## 2013-08-19 ENCOUNTER — Ambulatory Visit (INDEPENDENT_AMBULATORY_CARE_PROVIDER_SITE_OTHER): Payer: Medicare Other | Admitting: Cardiology

## 2013-08-19 VITALS — BP 136/83 | HR 58 | Ht 70.0 in | Wt 208.0 lb

## 2013-08-19 DIAGNOSIS — N289 Disorder of kidney and ureter, unspecified: Secondary | ICD-10-CM | POA: Diagnosis not present

## 2013-08-19 DIAGNOSIS — I2510 Atherosclerotic heart disease of native coronary artery without angina pectoris: Secondary | ICD-10-CM | POA: Diagnosis not present

## 2013-08-19 DIAGNOSIS — I10 Essential (primary) hypertension: Secondary | ICD-10-CM | POA: Diagnosis not present

## 2013-08-19 DIAGNOSIS — E785 Hyperlipidemia, unspecified: Secondary | ICD-10-CM | POA: Diagnosis not present

## 2013-08-19 DIAGNOSIS — N189 Chronic kidney disease, unspecified: Secondary | ICD-10-CM

## 2013-08-19 MED ORDER — CLOPIDOGREL BISULFATE 75 MG PO TABS: 75.0000 mg | ORAL_TABLET | Freq: Every day | ORAL | Status: DC

## 2013-08-19 NOTE — Patient Instructions (Signed)
   Plavix refill sent to [facility] for 90 day supply with 3 refills Continue all current medications. Your physician wants you to follow up in: 6 months.  You will receive a reminder letter in the mail one-two months in advance.  If you don't receive a letter, please call our office to schedule the follow up appointment

## 2013-08-19 NOTE — Assessment & Plan Note (Signed)
He continues on Crestor, followed by Dr. Olena Leatherwood.

## 2013-08-19 NOTE — Progress Notes (Signed)
Clinical Summary Mr. Fritze is a 71 y.o.male last seen in November 2013. Interval history includes hospitalization at [facility] back in June with sepsis requiring vasopressor support with broad-spectrum antibiotics, acute renal failure related to ATN, culture negative UTI. He was taken off Glucophage and Cozaar at that time based on record review - now back on both medications. Creatinine improved from a peak of 5.5 down to 1.2 at discharge.  Echocardiogram in June of this year showed LVEF 50-55% with grade 1 diastolic dysfunction, inferolateral hypokinesis, MAC with trace mitral regurgitation, trace tricuspid regurgitation, RVSP 18 mm mercury.  He reports having recent lab work with Dr. Olena Leatherwood. From a cardiac perspective reports no angina or progressive shortness of breath. States he feels completely back to baseline after his acute illness in the summer months.  No Known Allergies  Current Outpatient Prescriptions  Medication Sig Dispense Refill  . aspirin 81 MG tablet Take 81 mg by mouth daily.        . carvedilol (COREG) 6.25 MG tablet Take 1 tablet (6.25 mg total) by mouth 2 (two) times daily with a meal.  180 tablet  3  . clopidogrel (PLAVIX) 75 MG tablet Take 1 tablet (75 mg total) by mouth daily.  90 tablet  3  . dutasteride (AVODART) 0.5 MG capsule Take 0.5 mg by mouth daily.        Marland Kitchen glipiZIDE (GLUCOTROL) 10 MG tablet Take 10 mg by mouth daily before breakfast.      . hydrochlorothiazide (HYDRODIURIL) 25 MG tablet Take 1 tablet (25 mg total) by mouth daily.  90 tablet  3  . loratadine (CLARITIN) 10 MG tablet Take 10 mg by mouth daily as needed.       Marland Kitchen losartan (COZAAR) 100 MG tablet Take 1 tablet (100 mg total) by mouth daily.  90 tablet  3  . metFORMIN (GLUCOPHAGE) 1000 MG tablet Take 1,000 mg by mouth daily with breakfast.      . nitroGLYCERIN (NITROSTAT) 0.4 MG SL tablet Place 1 tablet (0.4 mg total) under the tongue every 5 (five) minutes as needed for chest pain. If no  relief after 3rd dose, proceed to ED  25 tablet  3  . rosuvastatin (CRESTOR) 10 MG tablet Take 0.5 tablets (5 mg total) by mouth daily.  45 tablet  3  . zolpidem (AMBIEN) 5 MG tablet Take 5 mg by mouth daily as needed.         No current facility-administered medications for this visit.    Past Medical History  Diagnosis Date  . Type 2 diabetes mellitus   . Mixed hyperlipidemia   . Essential hypertension, benign   . Carotid artery disease     50%, bilateral  by CTA 2011  . Coronary atherosclerosis of native coronary artery     DES to LAD/OM Aug 2009  . Myocardial infarction 05/2008    Out of hospital late presentation inferoposterolateral myocardial infarction  . Sinus bradycardia     Social History Mr. Strollo reports that he quit smoking about 29 years ago. His smoking use included Cigarettes. He has a 40 pack-year smoking history. He has quit using smokeless tobacco. His smokeless tobacco use included Chew. Mr. Klus reports that he does not drink alcohol.  Review of Systems No palpitations, dizziness, syncope. No bleeding problems. Otherwise negative.  Physical Examination Filed Vitals:   08/19/13 1112  BP: 136/83  Pulse: 58   Filed Weights   08/19/13 1112  Weight: 208 lb (94.348 kg)  No acute distress.  HEENT: Conjunctiva and lids normal, oropharynx with moist mucosa.  Neck: Supple, soft carotid bruits, no thyromegaly.  Lungs: Diminished breath sounds, nonlabored.  Cardiac: Regular rate and rhythm, no significant murmur or gallop.  Abdomen: Soft, nontender, bowel sounds present.  Skin: Warm and dry.  Musculoskeletal: No kyphosis.  Extremities: No pitting edema, distal pulses one plus.  Neuropsychiatric: Alert and oriented x3, affect appropriate.   Problem List and Plan   CORONARY ATHEROSCLEROSIS NATIVE CORONARY ARTERY Continue medical therapy. Refill for Plavix provided. Followup arranged.  DYSLIPIDEMIA He continues on Crestor, followed by Dr.  Olena Leatherwood.  Essential hypertension, benign No change in current regimen.  Acute on chronic renal insufficiency Acute illness back in June reviewed. Creatinine at discharge was 1.2. Will request most recent lab work for review.    Jonelle Sidle, M.D., F.A.C.C.

## 2013-08-19 NOTE — Assessment & Plan Note (Signed)
Continue medical therapy. Refill for Plavix provided. Followup arranged.

## 2013-08-19 NOTE — Assessment & Plan Note (Signed)
Acute illness back in June reviewed. Creatinine at discharge was 1.2. Will request most recent lab work for review.

## 2013-08-19 NOTE — Assessment & Plan Note (Signed)
No change in current regimen. 

## 2013-08-29 ENCOUNTER — Ambulatory Visit: Payer: Medicare Other | Admitting: Cardiology

## 2013-10-17 ENCOUNTER — Other Ambulatory Visit: Payer: Self-pay | Admitting: Cardiology

## 2013-10-17 MED ORDER — LOSARTAN POTASSIUM 100 MG PO TABS: 100.0000 mg | ORAL_TABLET | Freq: Every day | ORAL | Status: DC

## 2013-11-14 ENCOUNTER — Other Ambulatory Visit: Payer: Self-pay | Admitting: *Deleted

## 2013-11-14 MED ORDER — CARVEDILOL 6.25 MG PO TABS: 6.2500 mg | ORAL_TABLET | Freq: Two times a day (BID) | ORAL | Status: DC

## 2013-11-17 DIAGNOSIS — I10 Essential (primary) hypertension: Secondary | ICD-10-CM | POA: Diagnosis not present

## 2013-11-17 DIAGNOSIS — IMO0001 Reserved for inherently not codable concepts without codable children: Secondary | ICD-10-CM | POA: Diagnosis not present

## 2013-11-17 DIAGNOSIS — Z0000 Encounter for general adult medical examination without abnormal findings: Secondary | ICD-10-CM | POA: Diagnosis not present

## 2013-11-20 ENCOUNTER — Other Ambulatory Visit: Payer: Self-pay | Admitting: Vascular Surgery

## 2013-11-20 DIAGNOSIS — I6529 Occlusion and stenosis of unspecified carotid artery: Secondary | ICD-10-CM

## 2013-12-15 ENCOUNTER — Other Ambulatory Visit: Payer: Self-pay | Admitting: Cardiology

## 2013-12-15 MED ORDER — NITROGLYCERIN 0.4 MG SL SUBL: 0.4000 mg | SUBLINGUAL_TABLET | SUBLINGUAL | Status: DC | PRN

## 2014-01-01 ENCOUNTER — Other Ambulatory Visit (HOSPITAL_COMMUNITY): Payer: Medicare Other

## 2014-01-01 ENCOUNTER — Ambulatory Visit: Payer: Medicare Other | Admitting: Vascular Surgery

## 2014-01-07 ENCOUNTER — Encounter: Payer: Self-pay | Admitting: Family

## 2014-01-08 ENCOUNTER — Ambulatory Visit (INDEPENDENT_AMBULATORY_CARE_PROVIDER_SITE_OTHER): Payer: Medicare Other | Admitting: Family

## 2014-01-08 ENCOUNTER — Encounter: Payer: Self-pay | Admitting: Family

## 2014-01-08 ENCOUNTER — Ambulatory Visit (HOSPITAL_COMMUNITY)
Admission: RE | Admit: 2014-01-08 | Discharge: 2014-01-08 | Disposition: A | Discharge status: Home / Self Care | Payer: Medicare Other | Source: Ambulatory Visit | Attending: Family | Admitting: Family

## 2014-01-08 VITALS — BP 134/85 | HR 59 | Resp 14 | Ht 69.5 in | Wt 188.0 lb

## 2014-01-08 DIAGNOSIS — I6529 Occlusion and stenosis of unspecified carotid artery: Secondary | ICD-10-CM | POA: Diagnosis not present

## 2014-01-08 NOTE — Progress Notes (Signed)
Established Carotid Patient   History of Present Illness  Robert E Stann Sr. is a 73 y.o. male patient under surveillance for carotid artery stenosis and he returns today for this.  CTA from 2011 demonstrated carotid artery disease 50% bilateral. He returns today for routine surveillance.  He had an MI in 2009, had 3 cardiac stents placed. He denies claudication symptoms, denies non healing wounds.  Patient has not had previous carotid artery intervention.  Patient has Negative history of TIA or stroke symptom.  The patient denies amaurosis fugax or monocular blindness.  The patient  denies facial drooping.  Pt. denies hemiplegia.  The patient denies receptive or expressive aphasia.  Pt. denies extremity weakness.   Patient reports New Medical or Surgical History: he was hospitalized after tick bite August, 2014, high fever, near renal failure; states he feels well now.  Pt Diabetic: Yes, states in good control Pt smoker: former smoker, quit in the 1980's  Pt meds include: Statin : Yes ASA: Yes Other anticoagulants/antiplatelets: Plavix   Past Medical History  Diagnosis Date  . Type 2 diabetes mellitus   . Mixed hyperlipidemia   . Essential hypertension, benign   . Carotid artery disease     50%, bilateral  by CTA 2011  . Coronary atherosclerosis of native coronary artery     DES to LAD/OM Aug 2009  . Myocardial infarction 05/2008    Out of hospital late presentation inferoposterolateral myocardial infarction  . Sinus bradycardia   . Tick bite Aug. 2014    Leg    Social History History  Substance Use Topics  . Smoking status: Former Smoker -- 2.00 packs/day for 20 years    Types: Cigarettes    Quit date: 10/17/1983  . Smokeless tobacco: Former Systems developer    Types: Chew     Comment: quit 1992 chewing  . Alcohol Use: No    Family History Family History  Problem Relation Age of Onset  . Coronary artery disease    . Cancer Mother     Ovarian  . Cancer Father      Bone    Surgical History History reviewed. No pertinent past surgical history.  No Known Allergies  Current Outpatient Prescriptions  Medication Sig Dispense Refill  . aspirin 81 MG tablet Take 81 mg by mouth daily.        . carvedilol (COREG) 6.25 MG tablet Take 1 tablet (6.25 mg total) by mouth 2 (two) times daily with a meal.  180 tablet  3  . clopidogrel (PLAVIX) 75 MG tablet Take 1 tablet (75 mg total) by mouth daily.  90 tablet  3  . dutasteride (AVODART) 0.5 MG capsule Take 0.5 mg by mouth daily.        Marland Kitchen glipiZIDE (GLUCOTROL) 10 MG tablet Take 10 mg by mouth daily before breakfast.      . hydrochlorothiazide (HYDRODIURIL) 25 MG tablet Take 1 tablet (25 mg total) by mouth daily.  90 tablet  3  . loratadine (CLARITIN) 10 MG tablet Take 10 mg by mouth daily as needed.       Marland Kitchen losartan (COZAAR) 100 MG tablet Take 1 tablet (100 mg total) by mouth daily.  90 tablet  3  . metFORMIN (GLUCOPHAGE) 1000 MG tablet Take 1,000 mg by mouth daily with breakfast.      . nitroGLYCERIN (NITROSTAT) 0.4 MG SL tablet Place 1 tablet (0.4 mg total) under the tongue every 5 (five) minutes as needed for chest pain. If no relief after  3rd dose, proceed to ED  25 tablet  3  . rosuvastatin (CRESTOR) 10 MG tablet Take 0.5 tablets (5 mg total) by mouth daily.  45 tablet  3  . zolpidem (AMBIEN) 5 MG tablet Take 5 mg by mouth daily as needed.         No current facility-administered medications for this visit.    Review of Systems : See HPI for pertinent positives and negatives.  Physical Examination  Filed Vitals:   01/08/14 1216  BP: 134/85  Pulse: 59  Resp: 14   Filed Weights   01/08/14 1216  Weight: 188 lb (85.276 kg)   Body mass index is 27.37 kg/(m^2).  General: WDWN male in NAD GAIT: normal Eyes: PERRLA Pulmonary:  Non-labored, CTAB, Negative  Rales, Negative rhonchi, & Negative wheezing.  Cardiac: regular Rhythm ,  Negative detected murmur.  VASCULAR EXAM Carotid Bruits Left Right    Negative Negative   Radial pulses are 2+ palpable and equal.                                                                                                                            LE Pulses LEFT RIGHT       POPLITEAL  not palpable   not palpable       POSTERIOR TIBIAL  not palpable   not palpable        DORSALIS PEDIS      ANTERIOR TIBIAL  palpable   palpable     Gastrointestinal: soft, nontender, BS WNL, no r/g,  negative masses.  Musculoskeletal: Negative muscle atrophy/wasting. M/S 5/5 throughout, Extremities without ischemic changes.  Neurologic: A&O X 3; Appropriate Affect ; SENSATION normal;  Speech is normal CN 2-12 intact, Pain and light touch intact in extremities, Motor exam as listed above.   Non-Invasive Vascular Imaging CAROTID DUPLEX 01/08/2014   CEREBROVASCULAR DUPLEX EVALUATION    INDICATION: Carotid stenosis    PREVIOUS INTERVENTION(S): N/A    DUPLEX EXAM:     RIGHT  LEFT  Peak Systolic Velocities (cm/s) End Diastolic Velocities (cm/s) Plaque LOCATION Peak Systolic Velocities (cm/s) End Diastolic Velocities (cm/s) Plaque  84 14  CCA PROXIMAL 115 12   101 18  CCA MID 93 22   73 16 CP CCA DISTAL 78 22 HT  114 13 CP ECA 100 18 HT  68 16 CP ICA PROXIMAL 123 34 HT  103 35  ICA MID 74 26   86 31  ICA DISTAL 59 18     0.67 ICA / CCA Ratio (PSV) 1.33  Antegrade Vertebral Flow Antegrade  419 Brachial Systolic Pressure (mmHg) 379  Triphasic Brachial Artery Waveforms Triphasic    Plaque Morphology:  HM = Homogeneous, HT = Heterogeneous, CP = Calcific Plaque, SP = Smooth Plaque, IP = Irregular Plaque     ADDITIONAL FINDINGS:     IMPRESSION: Bilateral internal carotid artery stenosis present in the less than 40% range.    Compared to the  previous exam:  Unchanged since previous study on 12/26/2012.      Assessment: Robert E Zoss Sr. is a 73 y.o. male who presents for continuing surveillance of asymptomatic mild carotid artery  stenosis. Bilateral internal carotid artery stenosis present in the less than 40% range. The  ICA stenosis is unchanged since previous study on 12/26/2012.  Plan: Follow-up in 1 year with Carotid Duplex scan.   I discussed in depth with the patient the nature of atherosclerosis, and emphasized the importance of maximal medical management including strict control of blood pressure, blood glucose, and lipid levels, obtaining regular exercise, and continued cessation of smoking.  The patient is aware that without maximal medical management the underlying atherosclerotic disease process will progress, limiting the benefit of any interventions. The patient was given information about stroke prevention and what symptoms should prompt the patient to seek immediate medical care. Thank you for allowing [place] to participate in this patient's care.  Clemon Chambers, RN, MSN, FNP-C [facility] Office: [facility] Physician: Oneida Alar  01/08/2014 12:29 PM

## 2014-01-20 DIAGNOSIS — Z85828 Personal history of other malignant neoplasm of skin: Secondary | ICD-10-CM | POA: Diagnosis not present

## 2014-01-20 DIAGNOSIS — L570 Actinic keratosis: Secondary | ICD-10-CM | POA: Diagnosis not present

## 2014-01-29 DIAGNOSIS — L7211 Pilar cyst: Secondary | ICD-10-CM | POA: Diagnosis not present

## 2014-01-29 DIAGNOSIS — D485 Neoplasm of uncertain behavior of skin: Secondary | ICD-10-CM | POA: Diagnosis not present

## 2014-02-13 ENCOUNTER — Ambulatory Visit (INDEPENDENT_AMBULATORY_CARE_PROVIDER_SITE_OTHER): Payer: Medicare Other | Admitting: Cardiology

## 2014-02-13 ENCOUNTER — Encounter: Payer: Self-pay | Admitting: Cardiology

## 2014-02-13 VITALS — BP 123/68 | HR 53 | Ht 70.0 in | Wt 192.0 lb

## 2014-02-13 DIAGNOSIS — I6529 Occlusion and stenosis of unspecified carotid artery: Secondary | ICD-10-CM | POA: Diagnosis not present

## 2014-02-13 DIAGNOSIS — E785 Hyperlipidemia, unspecified: Secondary | ICD-10-CM | POA: Diagnosis not present

## 2014-02-13 DIAGNOSIS — I10 Essential (primary) hypertension: Secondary | ICD-10-CM | POA: Diagnosis not present

## 2014-02-13 DIAGNOSIS — I2510 Atherosclerotic heart disease of native coronary artery without angina pectoris: Secondary | ICD-10-CM | POA: Diagnosis not present

## 2014-02-13 NOTE — Patient Instructions (Signed)
Continue all current medications. Your physician wants you to follow up in: 6 months.  You will receive a reminder letter in the mail one-two months in advance.  If you don't receive a letter, please call our office to schedule the follow up appointment   

## 2014-02-13 NOTE — Progress Notes (Signed)
Clinical Summary [name] is a 73 y.o.male last seen in November 2014. He has been doing well from a cardiac perspective, no angina or progressive shortness of breath, no cardiac hospitalizations.  Echocardiogram in June 2014 showed LVEF 50-55% with grade 1 diastolic dysfunction, inferolateral hypokinesis, MAC with trace mitral regurgitation, trace tricuspid regurgitation, RVSP 18 mm mercury.  Carotid Dopplers from March of this year demonstrated bilateral ICA stenoses less than 40%. He continues to follow with vascular surgery.  We reviewed his cardiac medications. He continues to follow with Dr. Sherrie Sport for lipid management.   No Known Allergies  Current Outpatient Prescriptions  Medication Sig Dispense Refill  . aspirin 81 MG tablet Take 81 mg by mouth daily.        . carvedilol (COREG) 6.25 MG tablet Take 1 tablet (6.25 mg total) by mouth 2 (two) times daily with a meal.  180 tablet  3  . clopidogrel (PLAVIX) 75 MG tablet Take 1 tablet (75 mg total) by mouth daily.  90 tablet  3  . dutasteride (AVODART) 0.5 MG capsule Take 0.5 mg by mouth daily.        Marland Kitchen glipiZIDE (GLUCOTROL) 10 MG tablet Take 10 mg by mouth daily before breakfast.      . hydrochlorothiazide (HYDRODIURIL) 25 MG tablet Take 1 tablet (25 mg total) by mouth daily.  90 tablet  3  . loratadine (CLARITIN) 10 MG tablet Take 10 mg by mouth daily as needed.       Marland Kitchen losartan (COZAAR) 100 MG tablet Take 1 tablet (100 mg total) by mouth daily.  90 tablet  3  . metFORMIN (GLUCOPHAGE) 1000 MG tablet Take 1,000 mg by mouth daily with breakfast.      . nitroGLYCERIN (NITROSTAT) 0.4 MG SL tablet Place 1 tablet (0.4 mg total) under the tongue every 5 (five) minutes as needed for chest pain. If no relief after 3rd dose, proceed to ED  25 tablet  3  . rosuvastatin (CRESTOR) 10 MG tablet Take 0.5 tablets (5 mg total) by mouth daily.  45 tablet  3  . zolpidem (AMBIEN) 5 MG tablet Take 5 mg by mouth daily as needed.         No  current facility-administered medications for this visit.    Past Medical History  Diagnosis Date  . Type 2 diabetes mellitus   . Mixed hyperlipidemia   . Essential hypertension, benign   . Carotid artery disease     50%, bilateral  by CTA 2011  . Coronary atherosclerosis of native coronary artery     DES to LAD/OM Aug 2009  . Myocardial infarction 05/2008    Out of hospital late presentation inferoposterolateral myocardial infarction  . Sinus bradycardia   . Tick bite Aug. 2014    Leg    Social History Mr. Lowe reports that he quit smoking about 30 years ago. His smoking use included Cigarettes. He has a 40 pack-year smoking history. He has quit using smokeless tobacco. His smokeless tobacco use included Chew. Mr. Monks reports that he does not drink alcohol.  Review of Systems No palpitations or syncope. No claudication. Stable appetite. Otherwise negative.  Physical Examination Filed Vitals:   02/13/14 0856  BP: 123/68  Pulse: 53   Filed Weights   02/13/14 0856  Weight: 192 lb (87.091 kg)    No acute distress.  HEENT: Conjunctiva and lids normal, oropharynx with moist mucosa.  Neck: Supple, soft carotid bruits, no thyromegaly.  Lungs: Diminished breath  sounds, nonlabored.  Cardiac: Regular rate and rhythm, no significant murmur or gallop.  Abdomen: Soft, nontender, bowel sounds present.  Skin: Warm and dry.  Musculoskeletal: No kyphosis.  Extremities: No pitting edema, distal pulses one plus.  Neuropsychiatric: Alert and oriented x3, affect appropriate.   Problem List and Plan   CORONARY ATHEROSCLEROSIS NATIVE CORONARY ARTERY Symptomatically stable on medical therapy. Continue observation.  Essential hypertension, benign Blood pressure is normal today.  DYSLIPIDEMIA Continues on low-dose Crestor, followed by Dr. Sherrie Sport.  CAROTID STENOSIS He continues followup with vascular surgery, recent Dopplers noted above.    Satira Sark, M.D.,  F.A.C.C.

## 2014-02-13 NOTE — Assessment & Plan Note (Signed)
He continues followup with vascular surgery, recent Dopplers noted above.

## 2014-02-13 NOTE — Assessment & Plan Note (Signed)
Blood pressure is normal today. 

## 2014-02-13 NOTE — Assessment & Plan Note (Signed)
Symptomatically stable on medical therapy. Continue observation. 

## 2014-02-13 NOTE — Assessment & Plan Note (Signed)
Continues on low-dose Crestor, followed by Dr. Sherrie Sport.

## 2014-02-17 DIAGNOSIS — I10 Essential (primary) hypertension: Secondary | ICD-10-CM | POA: Diagnosis not present

## 2014-02-17 DIAGNOSIS — IMO0001 Reserved for inherently not codable concepts without codable children: Secondary | ICD-10-CM | POA: Diagnosis not present

## 2014-05-11 DIAGNOSIS — K140 Glossitis: Secondary | ICD-10-CM | POA: Diagnosis not present

## 2014-05-11 DIAGNOSIS — IMO0001 Reserved for inherently not codable concepts without codable children: Secondary | ICD-10-CM | POA: Diagnosis not present

## 2014-07-06 DIAGNOSIS — IMO0001 Reserved for inherently not codable concepts without codable children: Secondary | ICD-10-CM | POA: Diagnosis not present

## 2014-07-06 DIAGNOSIS — M76899 Other specified enthesopathies of unspecified lower limb, excluding foot: Secondary | ICD-10-CM | POA: Diagnosis not present

## 2014-07-27 DIAGNOSIS — L570 Actinic keratosis: Secondary | ICD-10-CM | POA: Diagnosis not present

## 2014-07-27 DIAGNOSIS — D045 Carcinoma in situ of skin of trunk: Secondary | ICD-10-CM | POA: Diagnosis not present

## 2014-07-27 DIAGNOSIS — D0439 Carcinoma in situ of skin of other parts of face: Secondary | ICD-10-CM | POA: Diagnosis not present

## 2014-07-27 DIAGNOSIS — Z85828 Personal history of other malignant neoplasm of skin: Secondary | ICD-10-CM | POA: Diagnosis not present

## 2014-07-27 DIAGNOSIS — L98499 Non-pressure chronic ulcer of skin of other sites with unspecified severity: Secondary | ICD-10-CM | POA: Diagnosis not present

## 2014-07-27 DIAGNOSIS — D485 Neoplasm of uncertain behavior of skin: Secondary | ICD-10-CM | POA: Diagnosis not present

## 2014-07-30 ENCOUNTER — Encounter: Payer: Self-pay | Admitting: Cardiology

## 2014-07-30 ENCOUNTER — Ambulatory Visit (INDEPENDENT_AMBULATORY_CARE_PROVIDER_SITE_OTHER): Payer: Medicare Other | Admitting: Cardiology

## 2014-07-30 VITALS — BP 127/78 | HR 52 | Ht 69.0 in | Wt 194.5 lb

## 2014-07-30 DIAGNOSIS — I2510 Atherosclerotic heart disease of native coronary artery without angina pectoris: Secondary | ICD-10-CM

## 2014-07-30 DIAGNOSIS — I10 Essential (primary) hypertension: Secondary | ICD-10-CM

## 2014-07-30 DIAGNOSIS — E782 Mixed hyperlipidemia: Secondary | ICD-10-CM | POA: Diagnosis not present

## 2014-07-30 MED ORDER — CARVEDILOL 6.25 MG PO TABS: 6.2500 mg | ORAL_TABLET | Freq: Two times a day (BID) | ORAL | Status: DC

## 2014-07-30 NOTE — Patient Instructions (Signed)
Your physician recommends that you schedule a follow-up appointment in: 6 months. You will receive a reminder letter in the mail in about 4 months reminding you to call and schedule your appointment. If you don't receive this letter, please contact our office. Your physician has recommended you make the following change in your medication:  Stop plavix. Continue all other medications the same. 

## 2014-07-30 NOTE — Assessment & Plan Note (Signed)
No active angina symptoms on medical therapy. We will discontinue Plavix, continue aspirin. No clear indication for followup stress testing at this time.

## 2014-07-30 NOTE — Progress Notes (Signed)
Clinical Summary [name] is a 73 y.o.male last seen in May. He continues to do well without any significant angina. He raised a large garden the summer, enjoys working outdoors. He has not required any nitroglycerin.  Lab work is followed by Dr. Sherrie Sport. Plan to request most recent results.  Echocardiogram in June 2014 showed LVEF 50-55% with grade 1 diastolic dysfunction, inferolateral hypokinesis, MAC with trace mitral regurgitation, trace tricuspid regurgitation, RVSP 18 mm mercury.  Adenosine Cardiolite in August 2011 showed no diagnostic ST segment changes, LVEF 53%, fixed defects in the inferolateral wall consistent with scar, no ischemia.   No Known Allergies  Current Outpatient Prescriptions  Medication Sig Dispense Refill  . aspirin 81 MG tablet Take 81 mg by mouth daily.        . carvedilol (COREG) 6.25 MG tablet Take 1 tablet (6.25 mg total) by mouth 2 (two) times daily with a meal.  180 tablet  3  . dutasteride (AVODART) 0.5 MG capsule Take 0.5 mg by mouth daily.        Marland Kitchen glipiZIDE (GLUCOTROL) 10 MG tablet Take 10 mg by mouth daily before breakfast.      . hydrochlorothiazide (HYDRODIURIL) 25 MG tablet Take 1 tablet (25 mg total) by mouth daily.  90 tablet  3  . loratadine (CLARITIN) 10 MG tablet Take 10 mg by mouth daily as needed.       Marland Kitchen losartan (COZAAR) 100 MG tablet Take 1 tablet (100 mg total) by mouth daily.  90 tablet  3  . metFORMIN (GLUCOPHAGE) 1000 MG tablet Take 1,000 mg by mouth daily with breakfast.      . nitroGLYCERIN (NITROSTAT) 0.4 MG SL tablet Place 1 tablet (0.4 mg total) under the tongue every 5 (five) minutes as needed for chest pain. If no relief after 3rd dose, proceed to ED  25 tablet  3  . rosuvastatin (CRESTOR) 10 MG tablet Take 0.5 tablets (5 mg total) by mouth daily.  45 tablet  3  . zolpidem (AMBIEN) 5 MG tablet Take 5 mg by mouth daily as needed.         No current facility-administered medications for this visit.    Past Medical  History  Diagnosis Date  . Type 2 diabetes mellitus   . Mixed hyperlipidemia   . Essential hypertension, benign   . Carotid artery disease     50%, bilateral  by CTA 2011  . Coronary atherosclerosis of native coronary artery     DES to LAD/OM Aug 2009  . Myocardial infarction 05/2008    Out of hospital late presentation inferoposterolateral myocardial infarction  . Sinus bradycardia   . Tick bite Aug. 2014    Leg    Social History Mr. Rufo reports that he quit smoking about 30 years ago. His smoking use included Cigarettes. He has a 40 pack-year smoking history. He quit smokeless tobacco use about 23 years ago. His smokeless tobacco use included Chew. Mr. Kernodle reports that he drinks alcohol.  Review of Systems No palpitations or syncope. No spontaneous bleeding problems. Recent skin cancer removal from face. Stable appetite. Other systems reviewed and negative.  Physical Examination Filed Vitals:   07/30/14 0811  BP: 127/78  Pulse: 52   Filed Weights   07/30/14 0811  Weight: 194 lb 8 oz (88.225 kg)    No acute distress.  HEENT: Conjunctiva and lids normal, oropharynx with moist mucosa.  Neck: Supple, soft carotid bruits, no thyromegaly.  Lungs: Diminished breath sounds,  nonlabored.  Cardiac: Regular rate and rhythm, no significant murmur or gallop.  Abdomen: Soft, nontender, bowel sounds present.  Skin: Warm and dry.  Musculoskeletal: No kyphosis.  Extremities: No pitting edema, distal pulses one plus.  Neuropsychiatric: Alert and oriented x3, affect appropriate.   Problem List and Plan   CORONARY ATHEROSCLEROSIS NATIVE CORONARY ARTERY No active angina symptoms on medical therapy. We will discontinue Plavix, continue aspirin. No clear indication for followup stress testing at this time.  Mixed hyperlipidemia Continues on Crestor, lipid followup with Dr. Sherrie Sport.  Essential hypertension, benign Blood pressure well controlled today.    Satira Sark, M.D., F.A.C.C.

## 2014-07-30 NOTE — Assessment & Plan Note (Signed)
Continues on Crestor, lipid followup with Dr. Sherrie Sport.

## 2014-07-30 NOTE — Assessment & Plan Note (Signed)
Blood pressure well-controlled today. 

## 2014-08-13 DIAGNOSIS — L570 Actinic keratosis: Secondary | ICD-10-CM | POA: Diagnosis not present

## 2014-08-13 DIAGNOSIS — C44320 Squamous cell carcinoma of skin of unspecified parts of face: Secondary | ICD-10-CM | POA: Diagnosis not present

## 2014-08-27 DIAGNOSIS — C44320 Squamous cell carcinoma of skin of unspecified parts of face: Secondary | ICD-10-CM | POA: Diagnosis not present

## 2014-08-27 DIAGNOSIS — C44529 Squamous cell carcinoma of skin of other part of trunk: Secondary | ICD-10-CM | POA: Diagnosis not present

## 2014-09-15 DIAGNOSIS — E1169 Type 2 diabetes mellitus with other specified complication: Secondary | ICD-10-CM | POA: Diagnosis not present

## 2014-09-15 DIAGNOSIS — I2510 Atherosclerotic heart disease of native coronary artery without angina pectoris: Secondary | ICD-10-CM | POA: Diagnosis not present

## 2014-09-15 DIAGNOSIS — M179 Osteoarthritis of knee, unspecified: Secondary | ICD-10-CM | POA: Diagnosis not present

## 2014-10-06 DIAGNOSIS — K146 Glossodynia: Secondary | ICD-10-CM | POA: Diagnosis not present

## 2014-10-12 ENCOUNTER — Other Ambulatory Visit: Payer: Self-pay | Admitting: *Deleted

## 2014-10-12 MED ORDER — LOSARTAN POTASSIUM 100 MG PO TABS: 100.0000 mg | ORAL_TABLET | Freq: Every day | ORAL | Status: DC

## 2014-10-12 NOTE — Telephone Encounter (Signed)
Losartan refilled 

## 2014-12-21 DIAGNOSIS — Z125 Encounter for screening for malignant neoplasm of prostate: Secondary | ICD-10-CM | POA: Diagnosis not present

## 2014-12-21 DIAGNOSIS — Z1389 Encounter for screening for other disorder: Secondary | ICD-10-CM | POA: Diagnosis not present

## 2014-12-21 DIAGNOSIS — Z0000 Encounter for general adult medical examination without abnormal findings: Secondary | ICD-10-CM | POA: Diagnosis not present

## 2014-12-21 DIAGNOSIS — E1165 Type 2 diabetes mellitus with hyperglycemia: Secondary | ICD-10-CM | POA: Diagnosis not present

## 2014-12-21 DIAGNOSIS — I2510 Atherosclerotic heart disease of native coronary artery without angina pectoris: Secondary | ICD-10-CM | POA: Diagnosis not present

## 2014-12-21 DIAGNOSIS — I10 Essential (primary) hypertension: Secondary | ICD-10-CM | POA: Diagnosis not present

## 2015-01-14 ENCOUNTER — Ambulatory Visit: Payer: Medicare Other | Admitting: Family

## 2015-01-14 ENCOUNTER — Other Ambulatory Visit (HOSPITAL_COMMUNITY): Payer: Medicare Other

## 2015-01-26 DIAGNOSIS — L570 Actinic keratosis: Secondary | ICD-10-CM | POA: Diagnosis not present

## 2015-02-01 ENCOUNTER — Ambulatory Visit (INDEPENDENT_AMBULATORY_CARE_PROVIDER_SITE_OTHER): Payer: Medicare Other | Admitting: Cardiology

## 2015-02-01 ENCOUNTER — Encounter: Payer: Self-pay | Admitting: Cardiology

## 2015-02-01 ENCOUNTER — Encounter: Payer: Self-pay | Admitting: *Deleted

## 2015-02-01 VITALS — BP 130/70 | HR 53 | Ht 70.0 in | Wt 187.1 lb

## 2015-02-01 DIAGNOSIS — E782 Mixed hyperlipidemia: Secondary | ICD-10-CM | POA: Diagnosis not present

## 2015-02-01 DIAGNOSIS — I2510 Atherosclerotic heart disease of native coronary artery without angina pectoris: Secondary | ICD-10-CM

## 2015-02-01 DIAGNOSIS — I6523 Occlusion and stenosis of bilateral carotid arteries: Secondary | ICD-10-CM

## 2015-02-01 DIAGNOSIS — I10 Essential (primary) hypertension: Secondary | ICD-10-CM

## 2015-02-01 NOTE — Progress Notes (Signed)
Cardiology Office Note  Date: 02/01/2015   ID: Robert Banda Hayes Sr., DOB [birth year], MRN [id number]  PCP: Neale Burly, MD  Primary Cardiologist: Rozann Lesches, MD   Chief Complaint  Patient presents with  . Coronary Artery Disease  . Carotid artery disease  . Hyperlipidemia    History of Present Illness: Robert E Ordway Sr. is a 74 y.o. male last seen in October 2015. He presents for a routine follow-up visit. Reports no obvious angina symptoms, chronic mild shortness of breath with activity. He works outdoors on his property, has [place] acres. Has been slowing down somewhat over the years.  Last stress test was in 2011 as outlined below. We discussed obtaining a follow-up Lexiscan Cardiolite to reassess ischemic burden. He reports compliance with his medications, no recent nitroglycerin use. Follow-up tracing is reviewed below.  He continues to follow with Dr. Sherrie Sport for management of his glucose and follow-up lipid lab work.  He has relatively mild bilateral carotid artery disease, stable by Dopplers from last year. He has been seen by Dr. Bridgett Larsson.   Past Medical History  Diagnosis Date  . Type 2 diabetes mellitus   . Mixed hyperlipidemia   . Essential hypertension, benign   . Carotid artery disease     50%, bilateral  by CTA 2011  . Coronary atherosclerosis of native coronary artery     DES to LAD/OM Aug 2009  . Myocardial infarction 05/2008    Out of hospital late presentation inferoposterolateral myocardial infarction  . Sinus bradycardia   . Tick bite Aug. 2014    Leg    History reviewed. No pertinent past surgical history.  Current Outpatient Prescriptions  Medication Sig Dispense Refill  . aspirin 81 MG tablet Take 81 mg by mouth daily.      . carvedilol (COREG) 6.25 MG tablet Take 1 tablet (6.25 mg total) by mouth 2 (two) times daily with a meal. 180 tablet 3  . dutasteride (AVODART) 0.5 MG capsule Take 0.5 mg by mouth daily.      Marland Kitchen glipiZIDE (GLUCOTROL) 10  MG tablet Take 10 mg by mouth daily before breakfast.    . hydrochlorothiazide (HYDRODIURIL) 25 MG tablet Take 1 tablet (25 mg total) by mouth daily. 90 tablet 3  . loratadine (CLARITIN) 10 MG tablet Take 10 mg by mouth daily as needed.     Marland Kitchen losartan (COZAAR) 100 MG tablet Take 1 tablet (100 mg total) by mouth daily. 90 tablet 3  . metFORMIN (GLUCOPHAGE) 1000 MG tablet Take 1,000 mg by mouth daily with breakfast.    . nitroGLYCERIN (NITROSTAT) 0.4 MG SL tablet Place 1 tablet (0.4 mg total) under the tongue every 5 (five) minutes as needed for chest pain. If no relief after 3rd dose, proceed to ED 25 tablet 3  . rosuvastatin (CRESTOR) 10 MG tablet Take 0.5 tablets (5 mg total) by mouth daily. 45 tablet 3  . zolpidem (AMBIEN) 5 MG tablet Take 5 mg by mouth daily as needed.       No current facility-administered medications for this visit.    Allergies:  Review of patient's allergies indicates no known allergies.   Social History: The patient  reports that he quit smoking about 31 years ago. His smoking use included Cigarettes. He has a 40 pack-year smoking history. He quit smokeless tobacco use about 24 years ago. His smokeless tobacco use included Chew. He reports that he drinks alcohol. He reports that he does not use illicit drugs.  ROS:  Please see the history of present illness. Otherwise, complete review of systems is positive for arthritic pains.  All other systems are reviewed and negative.   Physical Exam: VS:  BP 130/70 mmHg  Pulse 53  Ht 5\' 10"  (1.778 m)  Wt 187 lb 1.9 oz (84.877 kg)  BMI 26.85 kg/m2  SpO2 99%, BMI Body mass index is 26.85 kg/(m^2).  Wt Readings from Last 3 Encounters:  02/01/15 187 lb 1.9 oz (84.877 kg)  07/30/14 194 lb 8 oz (88.225 kg)  02/13/14 192 lb (87.091 kg)     No acute distress.  HEENT: Conjunctiva and lids normal, oropharynx with moist mucosa.  Neck: Supple, soft carotid bruits, no thyromegaly.  Lungs: Diminished breath sounds,  nonlabored.  Cardiac: Regular rate and rhythm, no significant murmur or gallop.  Abdomen: Soft, nontender, bowel sounds present.  Skin: Warm and dry.  Musculoskeletal: No kyphosis.  Extremities: No pitting edema, distal pulses one plus.  Neuropsychiatric: Alert and oriented x3, affect appropriate.   ECG: ECG is ordered today and reviewed showing sinus bradycardia with old inferoposterior infarct pattern, nonspecific T-wave changes.   Recent Labwork:  02/17/2014 cholesterol 109, triglycerides 138, HDL 33, LDL 48, hemoglobin A1c 7.1.  Other Studies Reviewed Today:  !. Echocardiogram in June 2014 showed LVEF 50-55% with grade 1 diastolic dysfunction, inferolateral hypokinesis, MAC with trace mitral regurgitation, trace tricuspid regurgitation, RVSP 18 mm mercury.  2. Adenosine Cardiolite in August 2011 showed no diagnostic ST segment changes, LVEF 53%, fixed defects in the inferolateral wall consistent with scar, no ischemia.  Assessment and Plan:  1. CAD status post DES to the LAD and obtuse marginal in 2009, last ischemic testing was in 2011. We will plan to continue medical therapy and follow-up ischemic burden with a Lexiscan Cardiolite. Unless there are significant ischemic territories at risk, we will likely continue current strategy of medical therapy and observation. Recommended regular walking for exercise.  2. Hyperlipidemia, has been well-controlled over time on Crestor. Keep follow-up with Dr. Sherrie Sport.  3. Essential hypertension, blood pressure control looks good today.  4. Mild carotid artery disease.  Current medicines were reviewed with the patient today. No changes were made   Orders Placed This Encounter  Procedures  . EKG 12-Lead    Disposition: FU with me in 6 months.   Signed, Satira Sark, MD, [facility] 02/01/2015 9:05 AM    [facility] at [place], [place], [place] [address] Phone: [phone]; Fax: [phone]

## 2015-02-01 NOTE — Patient Instructions (Signed)
Your physician wants you to follow-up in: [place] DR. [name] You will receive a reminder letter in the mail two months in advance. If you don't receive a letter, please call our office to schedule the follow-up appointment.  Your physician recommends that you continue on your current medications as directed. Please refer to the Current Medication list given to you today.  Your physician has requested that you have a lexiscan myoview. For further information please visit [email or web address]. Please follow instruction sheet, as given.  Thank you for choosing [facility]!!

## 2015-02-08 ENCOUNTER — Ambulatory Visit (HOSPITAL_COMMUNITY)
Admission: RE | Admit: 2015-02-08 | Discharge: 2015-02-08 | Disposition: A | Discharge status: Home / Self Care | Payer: Medicare Other | Source: Ambulatory Visit | Attending: Cardiology | Admitting: Cardiology

## 2015-02-08 ENCOUNTER — Encounter (HOSPITAL_COMMUNITY): Payer: Self-pay

## 2015-02-08 ENCOUNTER — Encounter (HOSPITAL_COMMUNITY)
Admission: RE | Admit: 2015-02-08 | Discharge: 2015-02-08 | Disposition: A | Discharge status: Home / Self Care | Payer: Medicare Other | Source: Ambulatory Visit | Attending: Cardiology | Admitting: Cardiology

## 2015-02-08 DIAGNOSIS — I2510 Atherosclerotic heart disease of native coronary artery without angina pectoris: Secondary | ICD-10-CM | POA: Diagnosis not present

## 2015-02-08 DIAGNOSIS — I6523 Occlusion and stenosis of bilateral carotid arteries: Secondary | ICD-10-CM | POA: Diagnosis not present

## 2015-02-08 MED ORDER — TECHNETIUM TC 99M SESTAMIBI GENERIC - CARDIOLITE
10.0000 | Freq: Once | INTRAVENOUS | Status: AC | PRN
  Administered 2015-02-08: 10 via INTRAVENOUS

## 2015-02-08 MED ORDER — REGADENOSON 0.4 MG/5ML IV SOLN
0.4000 mg | Freq: Once | INTRAVENOUS | Status: AC
  Administered 2015-02-08: 0.4 mg via INTRAVENOUS

## 2015-02-08 MED ORDER — REGADENOSON 0.4 MG/5ML IV SOLN
INTRAVENOUS | Status: AC
  Administered 2015-02-08: 0.4 mg via INTRAVENOUS
  Filled 2015-02-08: qty 5

## 2015-02-08 MED ORDER — TECHNETIUM TC 99M SESTAMIBI - CARDIOLITE
30.0000 | Freq: Once | INTRAVENOUS | Status: AC | PRN
  Administered 2015-02-08: 30 via INTRAVENOUS

## 2015-02-08 MED ORDER — SODIUM CHLORIDE 0.9 % IJ SOLN
INTRAMUSCULAR | Status: AC
  Administered 2015-02-08: 10 mL
  Filled 2015-02-08: qty 3

## 2015-02-08 MED ORDER — SODIUM CHLORIDE 0.9 % IJ SOLN: 10.0000 mL | INTRAMUSCULAR | Status: DC | PRN

## 2015-02-08 NOTE — Progress Notes (Signed)
Stress Lab Nurses Notes - Wille Aubuchon Barletta Sr. 02/08/2015 Reason for doing test: reassess ischemia from 2011 Type of test: Lexiscan/Cardiolite Nurse performing test: Stevphen Rochester RN Nuclear Medicine Tech: Thornton Papas Tech: Not Applicable MD performing test: Chauncey Cruel McDowell/K Purcell Nails NP Family MD: Zada Girt Test explained and consent signed: Yes.   IV started: Saline lock started in radiology Symptoms: SOB and feeling like breathing from abd Treatment/Intervention: None Reason test stopped: protocol completed After recovery IV was: Saline Lock flushed Patient to return to Nuc. Med at : [place] Patient discharged: To Nuc MED Patient's Condition upon discharge was: stable Comments: symptoms resolved within 5 minutes. Resting bp 143/81 HR 43. Peak BP 164/82 HR 72 Cruz Bong M

## 2015-02-12 ENCOUNTER — Telehealth: Payer: Self-pay | Admitting: *Deleted

## 2015-02-12 NOTE — Telephone Encounter (Signed)
-----   Message from Satira Sark, MD sent at 02/08/2015 12:10 PM EDT ----- Reviewed. Stress test shows no large ischemic zones. We will continue medical therapy and observation.

## 2015-02-12 NOTE — Telephone Encounter (Signed)
Patient informed. 

## 2015-03-25 DIAGNOSIS — E1165 Type 2 diabetes mellitus with hyperglycemia: Secondary | ICD-10-CM | POA: Diagnosis not present

## 2015-03-25 DIAGNOSIS — I10 Essential (primary) hypertension: Secondary | ICD-10-CM | POA: Diagnosis not present

## 2015-03-25 DIAGNOSIS — E1121 Type 2 diabetes mellitus with diabetic nephropathy: Secondary | ICD-10-CM | POA: Diagnosis not present

## 2015-05-17 DIAGNOSIS — D485 Neoplasm of uncertain behavior of skin: Secondary | ICD-10-CM | POA: Diagnosis not present

## 2015-05-17 DIAGNOSIS — L570 Actinic keratosis: Secondary | ICD-10-CM | POA: Diagnosis not present

## 2015-05-17 DIAGNOSIS — Z85828 Personal history of other malignant neoplasm of skin: Secondary | ICD-10-CM | POA: Diagnosis not present

## 2015-07-19 DIAGNOSIS — I10 Essential (primary) hypertension: Secondary | ICD-10-CM | POA: Diagnosis not present

## 2015-07-19 DIAGNOSIS — E1121 Type 2 diabetes mellitus with diabetic nephropathy: Secondary | ICD-10-CM | POA: Diagnosis not present

## 2015-07-19 DIAGNOSIS — N400 Enlarged prostate without lower urinary tract symptoms: Secondary | ICD-10-CM | POA: Diagnosis not present

## 2015-08-04 ENCOUNTER — Encounter: Payer: Self-pay | Admitting: Cardiology

## 2015-08-04 ENCOUNTER — Ambulatory Visit (INDEPENDENT_AMBULATORY_CARE_PROVIDER_SITE_OTHER): Payer: Medicare Other | Admitting: Cardiology

## 2015-08-04 VITALS — BP 138/74 | HR 53 | Ht 70.0 in | Wt 199.8 lb

## 2015-08-04 DIAGNOSIS — I6523 Occlusion and stenosis of bilateral carotid arteries: Secondary | ICD-10-CM

## 2015-08-04 DIAGNOSIS — Z23 Encounter for immunization: Secondary | ICD-10-CM | POA: Diagnosis not present

## 2015-08-04 NOTE — Progress Notes (Signed)
Cardiology Office Note  Date: 08/04/2015   ID: Robert Banda Starkes Sr., DOB 12/20/1940, MRN [id number]  PCP: Neale Burly, MD  Primary Cardiologist: Rozann Lesches, MD   Chief Complaint  Patient presents with  . Coronary Artery Disease    History of Present Illness: Robert E Hooser Sr. is a 74 y.o. male last seen in April. He presents for a routine follow-up visit. Since last encounter he has not had any significant angina symptoms on medical therapy. Remains functional with his ADLs, reports NYHA class II dyspnea. He plans to do some hunting in the fall went or months.   He had a follow-up Cardiolite study done earlier this year, outlined below, and overall low risk. We discussed this today.   He continues to follow with Dr. Sherrie Sport. No major medication changes, we reviewed his list. He continues to tolerate Crestor, and lipids have been in good control over her time.  Today he did ask about getting a flu shot.   Past Medical History  Diagnosis Date  . Type 2 diabetes mellitus ([place])   . Mixed hyperlipidemia   . Essential hypertension, benign   . Carotid artery disease ([place])     50%, bilateral  by CTA 2011  . Coronary atherosclerosis of native coronary artery     DES to LAD/OM Aug 2009  . Myocardial infarction ([place]) 05/2008    Out of hospital late presentation inferoposterolateral myocardial infarction  . Sinus bradycardia   . Tick bite Aug. 2014    Leg    History reviewed. No pertinent past surgical history.  Current Outpatient Prescriptions  Medication Sig Dispense Refill  . aspirin 81 MG tablet Take 81 mg by mouth daily.      . carvedilol (COREG) 6.25 MG tablet Take 1 tablet (6.25 mg total) by mouth 2 (two) times daily with a meal. 180 tablet 3  . dutasteride (AVODART) 0.5 MG capsule Take 0.5 mg by mouth daily.      Marland Kitchen glipiZIDE (GLUCOTROL) 10 MG tablet Take 10 mg by mouth daily before breakfast.    . hydrochlorothiazide (HYDRODIURIL) 25 MG tablet Take 1 tablet  (25 mg total) by mouth daily. 90 tablet 3  . loratadine (CLARITIN) 10 MG tablet Take 10 mg by mouth daily as needed.     Marland Kitchen losartan (COZAAR) 100 MG tablet Take 1 tablet (100 mg total) by mouth daily. 90 tablet 3  . metFORMIN (GLUCOPHAGE) 1000 MG tablet Take 1,000 mg by mouth daily with breakfast.    . nitroGLYCERIN (NITROSTAT) 0.4 MG SL tablet Place 1 tablet (0.4 mg total) under the tongue every 5 (five) minutes as needed for chest pain. If no relief after 3rd dose, proceed to ED 25 tablet 3  . rosuvastatin (CRESTOR) 10 MG tablet Take 0.5 tablets (5 mg total) by mouth daily. 45 tablet 3  . zolpidem (AMBIEN) 5 MG tablet Take 5 mg by mouth daily as needed.       No current facility-administered medications for this visit.    Allergies:  Review of patient's allergies indicates no known allergies.   Social History: The patient  reports that he quit smoking about 31 years ago. His smoking use included Cigarettes. He has a 40 pack-year smoking history. He quit smokeless tobacco use about 24 years ago. His smokeless tobacco use included Chew. He reports that he drinks alcohol. He reports that he does not use illicit drugs.   ROS:  Please see the history of present illness. Otherwise, complete  review of systems is positive for arthritic pains.  All other systems are reviewed and negative.   Physical Exam: VS:  BP 138/74 mmHg  Pulse 53  Ht 5\' 10"  (1.778 m)  Wt 199 lb 12.8 oz (90.629 kg)  BMI 28.67 kg/m2  SpO2 98%, BMI Body mass index is 28.67 kg/(m^2).  Wt Readings from Last 3 Encounters:  08/04/15 199 lb 12.8 oz (90.629 kg)  02/01/15 187 lb 1.9 oz (84.877 kg)  07/30/14 194 lb 8 oz (88.225 kg)     No acute distress.  HEENT: Conjunctiva and lids normal, oropharynx clear.  Neck: Supple, soft carotid bruits, no thyromegaly.  Lungs: Diminished breath sounds, nonlabored.  Cardiac: Regular rate and rhythm, no significant murmur or gallop.  Abdomen: Soft, nontender, bowel sounds present.   Skin: Warm and dry.  Musculoskeletal: No kyphosis.  Extremities: No pitting edema, distal pulses one plus.  Neuropsychiatric: Alert and oriented x3, affect appropriate.   ECG: ECG is not ordered today.   Recent Labwork:  May 2015: Cholesterol 109, triglycerides 138, HDL 33, LDL 48, hemoglobin A1c 7.1  Other Studies Reviewed Today:  Echocardiogram in June 2014 showed LVEF 50-55% with grade 1 diastolic dysfunction, inferolateral hypokinesis, MAC with trace mitral regurgitation, trace tricuspid regurgitation, RVSP 18 mm mercury.  Lexiscan Cardiolite 02/08/2015: FINDINGS: Baseline tracing shows sinus bradycardia with evidence of previous inferior infarct and nonspecific T-wave changes. Lexiscan bolus was given in standard fashion. Heart rate increased from 45 beats per minute up to 75 beats per minute, and blood pressure increased from 143/81 up to 169/81. No chest pain was reported. There were no diagnostic ST segment abnormalities or significant arrhythmias.  Analysis of the raw perfusion data finds adequate myocardial radiotracer uptake.  Perfusion: There is a moderate-sized, moderate intensity, apical to basal inferolateral defect that is fixed in most consistent with scar. No large ischemic zones noted.  Wall Motion: Mild basal lateral hypokinesis  Left Ventricular Ejection Fraction: 51 %  End diastolic volume 88 ml  End systolic volume 43 ml  TID ratio 1.08  IMPRESSION: 1. Inferolateral scar without large ischemic territories.  2. Mild basal lateral hypokinesis.  3. Left ventricular ejection fraction 51%  4. Overall low-risk stress test findings*.  Assessment and Plan:  1. Symptomatically stable CAD status post DES to the LAD/OM in August 2009. Recent follow-up Cardiolite in April was low risk showing component of inferolateral scar but no ischemia and LVEF 51%. We discussed continuing medical therapy and observation.  2. Hyperlipidemia,  tolerating Crestor. He has had good LDL control over time.  3. Essential hypertension, blood pressure control is reasonable today. No changes were made.  4. Type 2 diabetes mellitus, continues on oral agents, followed by Dr. Sherrie Sport.  Current medicines were reviewed with the patient today.   Orders Placed This Encounter  Procedures  . Flu Vaccine QUAD 36+ mos IM    Disposition: FU with me in 6 months.   Signed, Satira Sark, MD, [facility] 08/04/2015 8:24 AM    [facility] at [place], [place], [place] [address] Phone: [phone]; Fax: [phone]

## 2015-08-04 NOTE — Patient Instructions (Signed)
Your physician recommends that you continue on your current medications as directed. Please refer to the Current Medication list given to you today. Your physician recommends that you schedule a follow-up appointment in: 6 months. You will receive a reminder letter in the mail in about 4 months reminding you to call and schedule your appointment. If you don't receive this letter, please contact our office. 

## 2015-08-16 ENCOUNTER — Other Ambulatory Visit: Payer: Self-pay | Admitting: Cardiology

## 2015-09-14 DIAGNOSIS — D485 Neoplasm of uncertain behavior of skin: Secondary | ICD-10-CM | POA: Diagnosis not present

## 2015-09-14 DIAGNOSIS — L570 Actinic keratosis: Secondary | ICD-10-CM | POA: Diagnosis not present

## 2015-09-14 DIAGNOSIS — Z85828 Personal history of other malignant neoplasm of skin: Secondary | ICD-10-CM | POA: Diagnosis not present

## 2015-09-14 DIAGNOSIS — D0462 Carcinoma in situ of skin of left upper limb, including shoulder: Secondary | ICD-10-CM | POA: Diagnosis not present

## 2015-10-07 DIAGNOSIS — C44629 Squamous cell carcinoma of skin of left upper limb, including shoulder: Secondary | ICD-10-CM | POA: Diagnosis not present

## 2015-10-12 ENCOUNTER — Other Ambulatory Visit: Payer: Self-pay | Admitting: *Deleted

## 2015-10-12 ENCOUNTER — Other Ambulatory Visit: Payer: Self-pay

## 2015-10-12 MED ORDER — LOSARTAN POTASSIUM 100 MG PO TABS: 100.0000 mg | ORAL_TABLET | Freq: Every day | ORAL | Status: DC

## 2015-10-12 MED ORDER — NITROGLYCERIN 0.4 MG SL SUBL: 0.4000 mg | SUBLINGUAL_TABLET | SUBLINGUAL | Status: DC | PRN

## 2015-10-12 NOTE — Telephone Encounter (Signed)
ntg refill to [facility]

## 2015-11-08 DIAGNOSIS — I10 Essential (primary) hypertension: Secondary | ICD-10-CM | POA: Diagnosis not present

## 2015-11-08 DIAGNOSIS — N400 Enlarged prostate without lower urinary tract symptoms: Secondary | ICD-10-CM | POA: Diagnosis not present

## 2015-11-08 DIAGNOSIS — E1121 Type 2 diabetes mellitus with diabetic nephropathy: Secondary | ICD-10-CM | POA: Diagnosis not present

## 2015-11-22 DIAGNOSIS — K250 Acute gastric ulcer with hemorrhage: Secondary | ICD-10-CM | POA: Diagnosis not present

## 2015-12-06 DIAGNOSIS — K921 Melena: Secondary | ICD-10-CM | POA: Diagnosis not present

## 2015-12-20 DIAGNOSIS — Z79899 Other long term (current) drug therapy: Secondary | ICD-10-CM | POA: Diagnosis not present

## 2015-12-20 DIAGNOSIS — I2510 Atherosclerotic heart disease of native coronary artery without angina pectoris: Secondary | ICD-10-CM | POA: Diagnosis not present

## 2015-12-20 DIAGNOSIS — K259 Gastric ulcer, unspecified as acute or chronic, without hemorrhage or perforation: Secondary | ICD-10-CM | POA: Diagnosis not present

## 2015-12-20 DIAGNOSIS — E119 Type 2 diabetes mellitus without complications: Secondary | ICD-10-CM | POA: Diagnosis not present

## 2015-12-20 DIAGNOSIS — K921 Melena: Secondary | ICD-10-CM | POA: Diagnosis not present

## 2015-12-20 DIAGNOSIS — K2960 Other gastritis without bleeding: Secondary | ICD-10-CM | POA: Diagnosis not present

## 2015-12-20 DIAGNOSIS — K2970 Gastritis, unspecified, without bleeding: Secondary | ICD-10-CM | POA: Diagnosis not present

## 2015-12-20 DIAGNOSIS — E785 Hyperlipidemia, unspecified: Secondary | ICD-10-CM | POA: Diagnosis not present

## 2015-12-20 DIAGNOSIS — Z955 Presence of coronary angioplasty implant and graft: Secondary | ICD-10-CM | POA: Diagnosis not present

## 2015-12-20 DIAGNOSIS — K219 Gastro-esophageal reflux disease without esophagitis: Secondary | ICD-10-CM | POA: Diagnosis not present

## 2015-12-20 DIAGNOSIS — Z7982 Long term (current) use of aspirin: Secondary | ICD-10-CM | POA: Diagnosis not present

## 2015-12-20 DIAGNOSIS — Z7984 Long term (current) use of oral hypoglycemic drugs: Secondary | ICD-10-CM | POA: Diagnosis not present

## 2015-12-20 DIAGNOSIS — Z823 Family history of stroke: Secondary | ICD-10-CM | POA: Diagnosis not present

## 2015-12-20 DIAGNOSIS — I252 Old myocardial infarction: Secondary | ICD-10-CM | POA: Diagnosis not present

## 2015-12-20 DIAGNOSIS — Z85828 Personal history of other malignant neoplasm of skin: Secondary | ICD-10-CM | POA: Diagnosis not present

## 2015-12-20 DIAGNOSIS — I10 Essential (primary) hypertension: Secondary | ICD-10-CM | POA: Diagnosis not present

## 2015-12-20 DIAGNOSIS — Z809 Family history of malignant neoplasm, unspecified: Secondary | ICD-10-CM | POA: Diagnosis not present

## 2016-01-17 DIAGNOSIS — K250 Acute gastric ulcer with hemorrhage: Secondary | ICD-10-CM | POA: Diagnosis not present

## 2016-01-31 DIAGNOSIS — I252 Old myocardial infarction: Secondary | ICD-10-CM | POA: Diagnosis not present

## 2016-01-31 DIAGNOSIS — Z8711 Personal history of peptic ulcer disease: Secondary | ICD-10-CM | POA: Diagnosis not present

## 2016-01-31 DIAGNOSIS — M1990 Unspecified osteoarthritis, unspecified site: Secondary | ICD-10-CM | POA: Diagnosis not present

## 2016-01-31 DIAGNOSIS — I10 Essential (primary) hypertension: Secondary | ICD-10-CM | POA: Diagnosis not present

## 2016-01-31 DIAGNOSIS — Z6828 Body mass index (BMI) 28.0-28.9, adult: Secondary | ICD-10-CM | POA: Diagnosis not present

## 2016-01-31 DIAGNOSIS — Z7984 Long term (current) use of oral hypoglycemic drugs: Secondary | ICD-10-CM | POA: Diagnosis not present

## 2016-01-31 DIAGNOSIS — E669 Obesity, unspecified: Secondary | ICD-10-CM | POA: Diagnosis not present

## 2016-01-31 DIAGNOSIS — E119 Type 2 diabetes mellitus without complications: Secondary | ICD-10-CM | POA: Diagnosis not present

## 2016-01-31 DIAGNOSIS — I2510 Atherosclerotic heart disease of native coronary artery without angina pectoris: Secondary | ICD-10-CM | POA: Diagnosis not present

## 2016-01-31 DIAGNOSIS — K319 Disease of stomach and duodenum, unspecified: Secondary | ICD-10-CM | POA: Diagnosis not present

## 2016-01-31 DIAGNOSIS — Z79899 Other long term (current) drug therapy: Secondary | ICD-10-CM | POA: Diagnosis not present

## 2016-01-31 DIAGNOSIS — Z955 Presence of coronary angioplasty implant and graft: Secondary | ICD-10-CM | POA: Diagnosis not present

## 2016-01-31 DIAGNOSIS — Z823 Family history of stroke: Secondary | ICD-10-CM | POA: Diagnosis not present

## 2016-01-31 DIAGNOSIS — K449 Diaphragmatic hernia without obstruction or gangrene: Secondary | ICD-10-CM | POA: Diagnosis not present

## 2016-01-31 DIAGNOSIS — K219 Gastro-esophageal reflux disease without esophagitis: Secondary | ICD-10-CM | POA: Diagnosis not present

## 2016-01-31 DIAGNOSIS — K250 Acute gastric ulcer with hemorrhage: Secondary | ICD-10-CM | POA: Diagnosis not present

## 2016-02-04 ENCOUNTER — Encounter: Payer: Self-pay | Admitting: Cardiology

## 2016-02-04 ENCOUNTER — Ambulatory Visit (INDEPENDENT_AMBULATORY_CARE_PROVIDER_SITE_OTHER): Payer: Medicare Other | Admitting: Cardiology

## 2016-02-04 VITALS — BP 112/72 | HR 57 | Ht 70.0 in | Wt 197.8 lb

## 2016-02-04 DIAGNOSIS — I10 Essential (primary) hypertension: Secondary | ICD-10-CM

## 2016-02-04 DIAGNOSIS — E782 Mixed hyperlipidemia: Secondary | ICD-10-CM | POA: Diagnosis not present

## 2016-02-04 DIAGNOSIS — I2510 Atherosclerotic heart disease of native coronary artery without angina pectoris: Secondary | ICD-10-CM

## 2016-02-04 NOTE — Patient Instructions (Signed)
Your physician recommends that you continue on your current medications as directed. Please refer to the Current Medication list given to you today. Your physician recommends that you schedule a follow-up appointment in: 6 months. You will receive a reminder letter in the mail in about 4 months reminding you to call and schedule your appointment. If you don't receive this letter, please contact our office. 

## 2016-02-04 NOTE — Progress Notes (Signed)
Cardiology Office Note  Date: 02/04/2016   ID: Robert Banda Micke Sr., DOB 20-Dec-1940, MRN [id number]  PCP: Neale Burly, MD  Primary Cardiologist: Rozann Lesches, MD   Chief Complaint  Patient presents with  . Coronary Artery Disease    History of Present Illness: Robert E Burkhalter Sr. is a 75 y.o. male last seen in October 2016. He presents for a routine follow-up visit. Since last encounter he has not had any progressing angina symptoms or worsening dyspnea on exertion. No nitroglycerin use. He does state that he had recent diagnosis of melanoma and underwent EGD with Dr. Britta Mccreedy. Reportedly had a gastric ulcer related to NSAIDs. He was off aspirin temporarily, is to start back this Sunday.  He underwent stress testing last year as outlined below, we have continued medical therapy. I reviewed his ECG today which shows sinus bradycardia with incomplete right bundle branch block and old inferior infarct pattern.  He continues to follow with Dr. Hasanaj for primary care.  Past Medical History  Diagnosis Date  . Type 2 diabetes mellitus (HCC)   . Mixed hyperlipidemia   . Essential hypertension, benign   . Carotid artery disease (HCC)     50 %, bilateral  by CTA 2011  . Coronary atherosclerosis of native coronary artery     DES to LAD/OM Aug 2009  . Myocardial infarction ([place]) 05/2008    Out of hospital late presentation inferoposterolateral myocardial infarction  . Sinus bradycardia   . Tick bite Aug. 2014    Leg    History reviewed. No pertinent past surgical history.  Current Outpatient Prescriptions  Medication Sig Dispense Refill  . carvedilol (COREG) 6.25 MG tablet TAKE ONE TABLET BY MOUTH TWICE DAILY WITH A MEAL 180 tablet 3  . dutasteride (AVODART) 0.5 MG capsule Take 0.5 mg by mouth daily.      Marland Kitchen glipiZIDE (GLUCOTROL) 10 MG tablet Take 10 mg by mouth daily before breakfast.    . hydrochlorothiazide (HYDRODIURIL) 25 MG tablet Take 1 tablet (25 mg total) by mouth  daily. 90 tablet 3  . loratadine (CLARITIN) 10 MG tablet Take 10 mg by mouth daily as needed.     Marland Kitchen losartan (COZAAR) 100 MG tablet Take 1 tablet (100 mg total) by mouth daily. 90 tablet 3  . metFORMIN (GLUCOPHAGE) 1000 MG tablet Take 1,000 mg by mouth daily with breakfast.    . nitroGLYCERIN (NITROSTAT) 0.4 MG SL tablet Place 1 tablet (0.4 mg total) under the tongue every 5 (five) minutes as needed for chest pain. If no relief after 3rd dose, proceed to ED 25 tablet 3  . omeprazole (PRILOSEC) 20 MG capsule Take 1 capsule by mouth daily.    . rosuvastatin (CRESTOR) 10 MG tablet Take 0.5 tablets (5 mg total) by mouth daily. 45 tablet 3  . zolpidem (AMBIEN) 5 MG tablet Take 5 mg by mouth daily as needed.      Marland Kitchen aspirin 81 MG tablet Take 81 mg by mouth daily. Reported on 02/04/2016     No current facility-administered medications for this visit.   Allergies:  Review of patient's allergies indicates no known allergies.   Social History: The patient  reports that he quit smoking about 32 years ago. His smoking use included Cigarettes. He has a 40 pack-year smoking history. He quit smokeless tobacco use about 25 years ago. His smokeless tobacco use included Chew. He reports that he drinks alcohol. He reports that he does not use illicit drugs.   ROS:  Please see the history of present illness. Otherwise, complete review of systems is positive for recent diagnosis of gastric ulcer.  All other systems are reviewed and negative.   Physical Exam: VS:  BP 112/72 mmHg  Pulse 57  Ht 5\' 10"  (1.778 m)  Wt 197 lb 12.8 oz (89.721 kg)  BMI 28.38 kg/m2  SpO2 97%, BMI Body mass index is 28.38 kg/(m^2).  Wt Readings from Last 3 Encounters:  02/04/16 197 lb 12.8 oz (89.721 kg)  08/04/15 199 lb 12.8 oz (90.629 kg)  02/01/15 187 lb 1.9 oz (84.877 kg)    No acute distress.  HEENT: Conjunctiva and lids normal, oropharynx clear.  Neck: Supple, soft carotid bruits, no thyromegaly.  Lungs: Diminished breath  sounds, nonlabored.  Cardiac: Regular rate and rhythm, no significant murmur or gallop.  Abdomen: Soft, nontender, bowel sounds present.  Skin: Warm and dry.  Musculoskeletal: No kyphosis.   ECG: I personally reviewed the prior tracing from 02/01/2015 which showed sinus bradycardia with old inferior infarct pattern and nonspecific T-wave changes.  Recent Labwork:  May 2015: Cholesterol 109, triglycerides 138, HDL 33, LDL 48, hemoglobin A1c 7.1%  Other Studies Reviewed Today:  Echocardiogram June 2014: LVEF 50-55% with grade 1 diastolic dysfunction, inferolateral hypokinesis, MAC with trace mitral regurgitation, trace tricuspid regurgitation, RVSP 18 mm mercury.  Lexiscan Cardiolite 02/08/2015: FINDINGS: Baseline tracing shows sinus bradycardia with evidence of previous inferior infarct and nonspecific T-wave changes. Lexiscan bolus was given in standard fashion. Heart rate increased from 45 beats per minute up to 75 beats per minute, and blood pressure increased from 143/81 up to 169/81. No chest pain was reported. There were no diagnostic ST segment abnormalities or significant arrhythmias.  Analysis of the raw perfusion data finds adequate myocardial radiotracer uptake.  Perfusion: There is a moderate-sized, moderate intensity, apical to basal inferolateral defect that is fixed in most consistent with scar. No large ischemic zones noted.  Wall Motion: Mild basal lateral hypokinesis  Left Ventricular Ejection Fraction: 51 %  End diastolic volume 88 ml  End systolic volume 43 ml  TID ratio 1.08  IMPRESSION: 1. Inferolateral scar without large ischemic territories.  2. Mild basal lateral hypokinesis.  3. Left ventricular ejection fraction 51%  4. Overall low-risk stress test findings*.  Assessment and Plan:  1. CAD status post DES to the LAD and obtuse marginal in August 2009. Follow-up Cardiolite from last year was overall low risk, LVEF 51%. He is  not reporting any angina on medical therapy. ECG reviewed today. Continue observation.  2. Essential hypertension, blood pressure is normal today. No changes made to current regimen.  3. Hyperlipidemia, on Crestor. Keep follow-up with Dr. Sherrie Sport.  Current medicines were reviewed with the patient today.   Orders Placed This Encounter  Procedures  . EKG 12-Lead    Disposition: FU with me in 6 months.   Signed, Satira Sark, MD, [facility] 02/04/2016 9:06 AM    [facility] [place], [place], [address] [address] Phone: [phone]; Fax: [phone]

## 2016-02-15 DIAGNOSIS — Z8711 Personal history of peptic ulcer disease: Secondary | ICD-10-CM | POA: Diagnosis not present

## 2016-02-15 DIAGNOSIS — E119 Type 2 diabetes mellitus without complications: Secondary | ICD-10-CM | POA: Diagnosis not present

## 2016-02-15 DIAGNOSIS — I10 Essential (primary) hypertension: Secondary | ICD-10-CM | POA: Diagnosis not present

## 2016-03-30 DIAGNOSIS — M158 Other polyosteoarthritis: Secondary | ICD-10-CM | POA: Diagnosis not present

## 2016-03-30 DIAGNOSIS — I10 Essential (primary) hypertension: Secondary | ICD-10-CM | POA: Diagnosis not present

## 2016-03-30 DIAGNOSIS — E119 Type 2 diabetes mellitus without complications: Secondary | ICD-10-CM | POA: Diagnosis not present

## 2016-04-17 DIAGNOSIS — C44229 Squamous cell carcinoma of skin of left ear and external auricular canal: Secondary | ICD-10-CM | POA: Diagnosis not present

## 2016-04-17 DIAGNOSIS — C44629 Squamous cell carcinoma of skin of left upper limb, including shoulder: Secondary | ICD-10-CM | POA: Diagnosis not present

## 2016-04-17 DIAGNOSIS — L570 Actinic keratosis: Secondary | ICD-10-CM | POA: Diagnosis not present

## 2016-04-17 DIAGNOSIS — D485 Neoplasm of uncertain behavior of skin: Secondary | ICD-10-CM | POA: Diagnosis not present

## 2016-04-17 DIAGNOSIS — Z85828 Personal history of other malignant neoplasm of skin: Secondary | ICD-10-CM | POA: Diagnosis not present

## 2016-05-04 DIAGNOSIS — C44629 Squamous cell carcinoma of skin of left upper limb, including shoulder: Secondary | ICD-10-CM | POA: Diagnosis not present

## 2016-05-04 DIAGNOSIS — C44229 Squamous cell carcinoma of skin of left ear and external auricular canal: Secondary | ICD-10-CM | POA: Diagnosis not present

## 2016-05-04 DIAGNOSIS — M158 Other polyosteoarthritis: Secondary | ICD-10-CM | POA: Diagnosis not present

## 2016-05-04 DIAGNOSIS — I10 Essential (primary) hypertension: Secondary | ICD-10-CM | POA: Diagnosis not present

## 2016-05-04 DIAGNOSIS — E119 Type 2 diabetes mellitus without complications: Secondary | ICD-10-CM | POA: Diagnosis not present

## 2016-05-16 DIAGNOSIS — I10 Essential (primary) hypertension: Secondary | ICD-10-CM | POA: Diagnosis not present

## 2016-05-16 DIAGNOSIS — E119 Type 2 diabetes mellitus without complications: Secondary | ICD-10-CM | POA: Diagnosis not present

## 2016-05-16 DIAGNOSIS — M158 Other polyosteoarthritis: Secondary | ICD-10-CM | POA: Diagnosis not present

## 2016-05-23 DIAGNOSIS — Z0000 Encounter for general adult medical examination without abnormal findings: Secondary | ICD-10-CM | POA: Diagnosis not present

## 2016-05-23 DIAGNOSIS — E1122 Type 2 diabetes mellitus with diabetic chronic kidney disease: Secondary | ICD-10-CM | POA: Diagnosis not present

## 2016-05-23 DIAGNOSIS — E784 Other hyperlipidemia: Secondary | ICD-10-CM | POA: Diagnosis not present

## 2016-05-23 DIAGNOSIS — K210 Gastro-esophageal reflux disease with esophagitis: Secondary | ICD-10-CM | POA: Diagnosis not present

## 2016-05-23 DIAGNOSIS — N401 Enlarged prostate with lower urinary tract symptoms: Secondary | ICD-10-CM | POA: Diagnosis not present

## 2016-05-23 DIAGNOSIS — I25118 Atherosclerotic heart disease of native coronary artery with other forms of angina pectoris: Secondary | ICD-10-CM | POA: Diagnosis not present

## 2016-05-23 DIAGNOSIS — Z1389 Encounter for screening for other disorder: Secondary | ICD-10-CM | POA: Diagnosis not present

## 2016-06-02 DIAGNOSIS — Z23 Encounter for immunization: Secondary | ICD-10-CM | POA: Diagnosis not present

## 2016-06-20 DIAGNOSIS — E119 Type 2 diabetes mellitus without complications: Secondary | ICD-10-CM | POA: Diagnosis not present

## 2016-06-20 DIAGNOSIS — I10 Essential (primary) hypertension: Secondary | ICD-10-CM | POA: Diagnosis not present

## 2016-06-20 DIAGNOSIS — M158 Other polyosteoarthritis: Secondary | ICD-10-CM | POA: Diagnosis not present

## 2016-08-01 ENCOUNTER — Encounter: Payer: Self-pay | Admitting: *Deleted

## 2016-08-01 NOTE — Progress Notes (Signed)
Cardiology Office Note  Date: 08/02/2016   ID: Robert Banda Calles Sr., DOB 1941-02-16, MRN [id number]  PCP: Neale Burly, MD  Primary Cardiologist: Rozann Lesches, MD   Chief Complaint  Patient presents with  . Coronary Artery Disease    History of Present Illness: Robert E Lafountain Sr. is a 75 y.o. male last seen in April. He presents for a routine follow-up visit. Remains active with outside chores, does not report any angina symptoms or nitroglycerin use. States that he has been compliant with his medications.  Current cardiac regimen includes aspirin, Coreg, HCTZ, Cozaar, Crestor, and as needed nitroglycerin. I am requesting his last lab work from Dr. Sherrie Sport.  He underwent stress testing in April 2016 as outlined below, low risk.  Past Medical History:  Diagnosis Date  . Carotid artery disease ([place])    50%, bilateral  by CTA 2011  . Coronary atherosclerosis of native coronary artery    DES to LAD/OM Aug 2009  . Essential hypertension, benign   . Mixed hyperlipidemia   . Myocardial infarction 05/2008   Out of hospital late presentation inferoposterolateral myocardial infarction  . Sinus bradycardia   . Tick bite Aug. 2014   Leg  . Type 2 diabetes mellitus ([place])     Current Outpatient Prescriptions  Medication Sig Dispense Refill  . aspirin 81 MG tablet Take 81 mg by mouth daily. Reported on 02/04/2016    . carvedilol (COREG) 6.25 MG tablet TAKE ONE TABLET BY MOUTH TWICE DAILY WITH A MEAL 180 tablet 3  . dutasteride (AVODART) 0.5 MG capsule Take 0.5 mg by mouth daily.      Marland Kitchen glipiZIDE (GLUCOTROL) 10 MG tablet Take 10 mg by mouth daily before breakfast.    . hydrochlorothiazide (HYDRODIURIL) 25 MG tablet Take 1 tablet (25 mg total) by mouth daily. 90 tablet 3  . loratadine (CLARITIN) 10 MG tablet Take 10 mg by mouth daily as needed.     Marland Kitchen losartan (COZAAR) 100 MG tablet Take 1 tablet (100 mg total) by mouth daily. 90 tablet 3  . metFORMIN (GLUCOPHAGE) 1000 MG tablet  Take 1,000 mg by mouth daily with breakfast.    . nitroGLYCERIN (NITROSTAT) 0.4 MG SL tablet Place 1 tablet (0.4 mg total) under the tongue every 5 (five) minutes as needed for chest pain. If no relief after 3rd dose, proceed to ED 25 tablet 3  . omeprazole (PRILOSEC) 20 MG capsule Take 1 capsule by mouth daily.    . rosuvastatin (CRESTOR) 5 MG tablet Take 1 tablet by mouth daily.    Marland Kitchen zolpidem (AMBIEN) 5 MG tablet Take 5 mg by mouth daily as needed.       No current facility-administered medications for this visit.    Allergies:  Review of patient's allergies indicates no known allergies.   Social History: The patient  reports that he quit smoking about 32 years ago. His smoking use included Cigarettes. He started smoking about 60 years ago. He has a 40.00 pack-year smoking history. He quit smokeless tobacco use about 25 years ago. His smokeless tobacco use included Chew. He reports that he drinks alcohol. He reports that he does not use drugs.   ROS:  Please see the history of present illness. Otherwise, complete review of systems is positive for none.  All other systems are reviewed and negative.   Physical Exam: VS:  BP (!) 154/79   Pulse (!) 49   Ht 5\' 10"  (1.778 m)   Wt 196 lb (  88.9 kg)   BMI 28.12 kg/m , BMI Body mass index is 28.12 kg/m.  Wt Readings from Last 3 Encounters:  08/02/16 196 lb (88.9 kg)  02/04/16 197 lb 12.8 oz (89.7 kg)  08/04/15 199 lb 12.8 oz (90.6 kg)    No acute distress.  HEENT: Conjunctiva and lids normal, oropharynx clear.  Neck: Supple, soft carotid bruits, no thyromegaly.  Lungs: Diminished breath sounds, nonlabored.  Cardiac: Regular rate and rhythm, no significant murmur or gallop.  Abdomen: Soft, nontender, bowel sounds present.  Skin: Warm and dry.  Musculoskeletal: No kyphosis.   ECG: I personally reviewed the tracing from 02/04/2016 which showed sinus bradycardia with incomplete right bundle branch block, old inferior infarct pattern,  nonspecific T-wave changes.  Recent Labwork:  May 2015: Cholesterol 109, triglycerides 138, HDL 33, LDL 48, hemoglobin A1c 7.1%  Other Studies Reviewed Today:  Lexiscan Cardiolite 02/08/2015: FINDINGS: Baseline tracing shows sinus bradycardia with evidence of previous inferior infarct and nonspecific T-wave changes. Lexiscan bolus was given in standard fashion. Heart rate increased from 45 beats per minute up to 75 beats per minute, and blood pressure increased from 143/81 up to 169/81. No chest pain was reported. There were no diagnostic ST segment abnormalities or significant arrhythmias.  Analysis of the raw perfusion data finds adequate myocardial radiotracer uptake.  Perfusion: There is a moderate-sized, moderate intensity, apical to basal inferolateral defect that is fixed in most consistent with scar. No large ischemic zones noted.  Wall Motion: Mild basal lateral hypokinesis  Left Ventricular Ejection Fraction: 51 %  End diastolic volume 88 ml  End systolic volume 43 ml  TID ratio 1.08  IMPRESSION: 1. Inferolateral scar without large ischemic territories.  2. Mild basal lateral hypokinesis.  3. Left ventricular ejection fraction 51%  4. Overall low-risk stress test findings*.  Assessment and Plan:  1. CAD status post DES to the LAD/OM in August 2009. He is symptomatically stable on medical therapy and had a low risk Cardiolite last year. No changes were made today.  2. Hyperlipidemia, continue on Crestor. Requesting most recent lipid panel from Dr. Sherrie Sport.  3. Essential hypertension, blood pressure is elevated today. He reports compliance with his medications. Discussed salt restriction and activity as well as weight loss.  Current medicines were reviewed with the patient today.   Disposition: Follow-up with me in 6 months.  Signed, Satira Sark, MD, [facility] 08/02/2016 8:54 AM    [facility] at [place], [place], [place] [address] Phone: [phone]; Fax: [phone]

## 2016-08-02 ENCOUNTER — Ambulatory Visit (INDEPENDENT_AMBULATORY_CARE_PROVIDER_SITE_OTHER): Payer: Medicare Other | Admitting: Cardiology

## 2016-08-02 ENCOUNTER — Encounter: Payer: Self-pay | Admitting: *Deleted

## 2016-08-02 ENCOUNTER — Encounter: Payer: Self-pay | Admitting: Cardiology

## 2016-08-02 VITALS — BP 154/79 | HR 49 | Ht 70.0 in | Wt 196.0 lb

## 2016-08-02 DIAGNOSIS — I2510 Atherosclerotic heart disease of native coronary artery without angina pectoris: Secondary | ICD-10-CM | POA: Diagnosis not present

## 2016-08-02 DIAGNOSIS — E782 Mixed hyperlipidemia: Secondary | ICD-10-CM

## 2016-08-02 DIAGNOSIS — I10 Essential (primary) hypertension: Secondary | ICD-10-CM

## 2016-08-02 NOTE — Patient Instructions (Signed)

## 2016-08-14 DIAGNOSIS — I10 Essential (primary) hypertension: Secondary | ICD-10-CM | POA: Diagnosis not present

## 2016-08-14 DIAGNOSIS — E119 Type 2 diabetes mellitus without complications: Secondary | ICD-10-CM | POA: Diagnosis not present

## 2016-08-14 DIAGNOSIS — M158 Other polyosteoarthritis: Secondary | ICD-10-CM | POA: Diagnosis not present

## 2016-08-16 ENCOUNTER — Other Ambulatory Visit: Payer: Self-pay | Admitting: Cardiology

## 2016-08-22 DIAGNOSIS — D485 Neoplasm of uncertain behavior of skin: Secondary | ICD-10-CM | POA: Diagnosis not present

## 2016-08-22 DIAGNOSIS — Z85828 Personal history of other malignant neoplasm of skin: Secondary | ICD-10-CM | POA: Diagnosis not present

## 2016-08-22 DIAGNOSIS — D0439 Carcinoma in situ of skin of other parts of face: Secondary | ICD-10-CM | POA: Diagnosis not present

## 2016-08-22 DIAGNOSIS — L570 Actinic keratosis: Secondary | ICD-10-CM | POA: Diagnosis not present

## 2016-08-25 DIAGNOSIS — H11153 Pinguecula, bilateral: Secondary | ICD-10-CM | POA: Diagnosis not present

## 2016-08-25 DIAGNOSIS — Z9849 Cataract extraction status, unspecified eye: Secondary | ICD-10-CM | POA: Diagnosis not present

## 2016-08-25 DIAGNOSIS — Z961 Presence of intraocular lens: Secondary | ICD-10-CM | POA: Diagnosis not present

## 2016-08-25 DIAGNOSIS — H3589 Other specified retinal disorders: Secondary | ICD-10-CM | POA: Diagnosis not present

## 2016-08-25 DIAGNOSIS — I10 Essential (primary) hypertension: Secondary | ICD-10-CM | POA: Diagnosis not present

## 2016-08-25 DIAGNOSIS — H43811 Vitreous degeneration, right eye: Secondary | ICD-10-CM | POA: Diagnosis not present

## 2016-08-25 DIAGNOSIS — H35031 Hypertensive retinopathy, right eye: Secondary | ICD-10-CM | POA: Diagnosis not present

## 2016-08-25 DIAGNOSIS — H35032 Hypertensive retinopathy, left eye: Secondary | ICD-10-CM | POA: Diagnosis not present

## 2016-08-25 DIAGNOSIS — E119 Type 2 diabetes mellitus without complications: Secondary | ICD-10-CM | POA: Diagnosis not present

## 2016-08-25 DIAGNOSIS — H524 Presbyopia: Secondary | ICD-10-CM | POA: Diagnosis not present

## 2016-08-25 DIAGNOSIS — Z7984 Long term (current) use of oral hypoglycemic drugs: Secondary | ICD-10-CM | POA: Diagnosis not present

## 2016-09-06 DIAGNOSIS — M158 Other polyosteoarthritis: Secondary | ICD-10-CM | POA: Diagnosis not present

## 2016-09-06 DIAGNOSIS — I10 Essential (primary) hypertension: Secondary | ICD-10-CM | POA: Diagnosis not present

## 2016-09-06 DIAGNOSIS — E119 Type 2 diabetes mellitus without complications: Secondary | ICD-10-CM | POA: Diagnosis not present

## 2016-09-21 DIAGNOSIS — E1122 Type 2 diabetes mellitus with diabetic chronic kidney disease: Secondary | ICD-10-CM | POA: Diagnosis not present

## 2016-09-21 DIAGNOSIS — E784 Other hyperlipidemia: Secondary | ICD-10-CM | POA: Diagnosis not present

## 2016-09-21 DIAGNOSIS — I25118 Atherosclerotic heart disease of native coronary artery with other forms of angina pectoris: Secondary | ICD-10-CM | POA: Diagnosis not present

## 2016-09-21 DIAGNOSIS — K210 Gastro-esophageal reflux disease with esophagitis: Secondary | ICD-10-CM | POA: Diagnosis not present

## 2016-09-28 ENCOUNTER — Other Ambulatory Visit: Payer: Self-pay | Admitting: Cardiology

## 2016-10-02 DIAGNOSIS — E1122 Type 2 diabetes mellitus with diabetic chronic kidney disease: Secondary | ICD-10-CM | POA: Diagnosis not present

## 2016-10-02 DIAGNOSIS — K210 Gastro-esophageal reflux disease with esophagitis: Secondary | ICD-10-CM | POA: Diagnosis not present

## 2016-10-02 DIAGNOSIS — I25118 Atherosclerotic heart disease of native coronary artery with other forms of angina pectoris: Secondary | ICD-10-CM | POA: Diagnosis not present

## 2016-10-02 DIAGNOSIS — E784 Other hyperlipidemia: Secondary | ICD-10-CM | POA: Diagnosis not present

## 2016-10-19 DIAGNOSIS — C44321 Squamous cell carcinoma of skin of nose: Secondary | ICD-10-CM | POA: Diagnosis not present

## 2016-10-23 DIAGNOSIS — E784 Other hyperlipidemia: Secondary | ICD-10-CM | POA: Diagnosis not present

## 2016-10-23 DIAGNOSIS — K210 Gastro-esophageal reflux disease with esophagitis: Secondary | ICD-10-CM | POA: Diagnosis not present

## 2016-10-23 DIAGNOSIS — I25118 Atherosclerotic heart disease of native coronary artery with other forms of angina pectoris: Secondary | ICD-10-CM | POA: Diagnosis not present

## 2016-10-23 DIAGNOSIS — E1122 Type 2 diabetes mellitus with diabetic chronic kidney disease: Secondary | ICD-10-CM | POA: Diagnosis not present

## 2016-10-24 DIAGNOSIS — L570 Actinic keratosis: Secondary | ICD-10-CM | POA: Diagnosis not present

## 2016-12-01 DIAGNOSIS — K210 Gastro-esophageal reflux disease with esophagitis: Secondary | ICD-10-CM | POA: Diagnosis not present

## 2016-12-01 DIAGNOSIS — E784 Other hyperlipidemia: Secondary | ICD-10-CM | POA: Diagnosis not present

## 2016-12-01 DIAGNOSIS — E1122 Type 2 diabetes mellitus with diabetic chronic kidney disease: Secondary | ICD-10-CM | POA: Diagnosis not present

## 2016-12-01 DIAGNOSIS — I25118 Atherosclerotic heart disease of native coronary artery with other forms of angina pectoris: Secondary | ICD-10-CM | POA: Diagnosis not present

## 2016-12-19 DIAGNOSIS — K210 Gastro-esophageal reflux disease with esophagitis: Secondary | ICD-10-CM | POA: Diagnosis not present

## 2016-12-19 DIAGNOSIS — E784 Other hyperlipidemia: Secondary | ICD-10-CM | POA: Diagnosis not present

## 2016-12-19 DIAGNOSIS — E1122 Type 2 diabetes mellitus with diabetic chronic kidney disease: Secondary | ICD-10-CM | POA: Diagnosis not present

## 2016-12-19 DIAGNOSIS — I25118 Atherosclerotic heart disease of native coronary artery with other forms of angina pectoris: Secondary | ICD-10-CM | POA: Diagnosis not present

## 2017-01-18 DIAGNOSIS — M150 Primary generalized (osteo)arthritis: Secondary | ICD-10-CM | POA: Diagnosis not present

## 2017-01-18 DIAGNOSIS — I10 Essential (primary) hypertension: Secondary | ICD-10-CM | POA: Diagnosis not present

## 2017-01-18 DIAGNOSIS — E119 Type 2 diabetes mellitus without complications: Secondary | ICD-10-CM | POA: Diagnosis not present

## 2017-01-29 ENCOUNTER — Ambulatory Visit (INDEPENDENT_AMBULATORY_CARE_PROVIDER_SITE_OTHER): Payer: Medicare Other | Admitting: Cardiology

## 2017-01-29 ENCOUNTER — Encounter: Payer: Self-pay | Admitting: Cardiology

## 2017-01-29 VITALS — BP 146/79 | HR 47 | Ht 70.0 in | Wt 191.8 lb

## 2017-01-29 DIAGNOSIS — I10 Essential (primary) hypertension: Secondary | ICD-10-CM

## 2017-01-29 DIAGNOSIS — D0462 Carcinoma in situ of skin of left upper limb, including shoulder: Secondary | ICD-10-CM | POA: Diagnosis not present

## 2017-01-29 DIAGNOSIS — R001 Bradycardia, unspecified: Secondary | ICD-10-CM | POA: Diagnosis not present

## 2017-01-29 DIAGNOSIS — Z85828 Personal history of other malignant neoplasm of skin: Secondary | ICD-10-CM | POA: Diagnosis not present

## 2017-01-29 DIAGNOSIS — I2510 Atherosclerotic heart disease of native coronary artery without angina pectoris: Secondary | ICD-10-CM | POA: Diagnosis not present

## 2017-01-29 DIAGNOSIS — D485 Neoplasm of uncertain behavior of skin: Secondary | ICD-10-CM | POA: Diagnosis not present

## 2017-01-29 DIAGNOSIS — D0439 Carcinoma in situ of skin of other parts of face: Secondary | ICD-10-CM | POA: Diagnosis not present

## 2017-01-29 DIAGNOSIS — L570 Actinic keratosis: Secondary | ICD-10-CM | POA: Diagnosis not present

## 2017-01-29 DIAGNOSIS — E782 Mixed hyperlipidemia: Secondary | ICD-10-CM | POA: Diagnosis not present

## 2017-01-29 DIAGNOSIS — C44229 Squamous cell carcinoma of skin of left ear and external auricular canal: Secondary | ICD-10-CM | POA: Diagnosis not present

## 2017-01-29 MED ORDER — CARVEDILOL 3.125 MG PO TABS: 3.1250 mg | ORAL_TABLET | Freq: Two times a day (BID) | ORAL | 3 refills | Status: DC

## 2017-01-29 NOTE — Progress Notes (Signed)
Cardiology Office Note  Date: 01/29/2017   ID: Robert Banda Wendling Sr., DOB [birth year], MRN [id number]  PCP: Neale Burly, MD  Primary Cardiologist: Rozann Lesches, MD   Chief Complaint  Patient presents with  . Coronary Artery Disease    History of Present Illness: Robert Wilner Hudspeth Sr. is a 76 y.o. male last seen in October 2017. He presents for a routine follow-up visit. Reports no obvious angina symptoms. States that he has been compliant with his regular medications.  I reviewed his lipid numbers from last year, outlined below.He is tolerating Crestor.  He underwent stress testing within the last 2 years. Plan is to continue observation at this point on medical therapy.  I personally reviewed his ECG today which shows sinus bradycardia with low voltage, old inferoposterior infarct pattern, nonspecific T-wave changes. We did discuss reducing his Coreg to 3.125 mg twice daily. He has not had any syncope.  Past Medical History:  Diagnosis Date  . Carotid artery disease ([place])    50%, bilateral  by CTA 2011  . Coronary atherosclerosis of native coronary artery    DES to LAD/OM Aug 2009  . Essential hypertension, benign   . Mixed hyperlipidemia   . Myocardial infarction ([place]) 05/2008   Out of hospital late presentation inferoposterolateral myocardial infarction  . Sinus bradycardia   . Tick bite Aug. 2014   Leg  . Type 2 diabetes mellitus ([place])     History reviewed. No pertinent surgical history.  Current Outpatient Prescriptions  Medication Sig Dispense Refill  . aspirin 81 MG tablet Take 81 mg by mouth daily. Reported on 02/04/2016    . carvedilol (COREG) 6.25 MG tablet TAKE ONE TABLET BY MOUTH TWICE DAILY WITH MEALS 180 tablet 3  . dutasteride (AVODART) 0.5 MG capsule Take 0.5 mg by mouth daily.      Marland Kitchen glipiZIDE (GLUCOTROL) 10 MG tablet Take 10 mg by mouth daily before breakfast.    . hydrochlorothiazide (HYDRODIURIL) 25 MG tablet Take 1 tablet (25 mg total) by mouth  daily. 90 tablet 3  . loratadine (CLARITIN) 10 MG tablet Take 10 mg by mouth daily as needed.     Marland Kitchen losartan (COZAAR) 100 MG tablet TAKE ONE TABLET BY MOUTH ONCE DAILY 90 tablet 3  . metFORMIN (GLUCOPHAGE) 1000 MG tablet Take 1,000 mg by mouth daily with breakfast.    . nitroGLYCERIN (NITROSTAT) 0.4 MG SL tablet Place 1 tablet (0.4 mg total) under the tongue every 5 (five) minutes as needed for chest pain. If no relief after 3rd dose, proceed to ED 25 tablet 3  . omeprazole (PRILOSEC) 20 MG capsule Take 1 capsule by mouth daily.    . rosuvastatin (CRESTOR) 5 MG tablet Take 1 tablet by mouth daily.    Marland Kitchen zolpidem (AMBIEN) 5 MG tablet Take 5 mg by mouth daily as needed.       No current facility-administered medications for this visit.    Allergies:  Patient has no known allergies.   Social History: The patient  reports that he quit smoking about 33 years ago. His smoking use included Cigarettes. He started smoking about 60 years ago. He has a 40.00 pack-year smoking history. He quit smokeless tobacco use about 26 years ago. His smokeless tobacco use included Chew. He reports that he drinks alcohol. He reports that he does not use drugs.   Family History: The patient's family history includes Cancer in his father and mother.   ROS:  Please see the history  of present illness. Otherwise, complete review of systems is positive for hearing loss.  All other systems are reviewed and negative.   Physical Exam: VS:  BP (!) 146/79   Pulse (!) 47   Ht 5\' 10"  (1.778 m)   Wt 191 lb 12.8 oz (87 kg)   SpO2 100%   BMI 27.52 kg/m , BMI Body mass index is 27.52 kg/m.  Wt Readings from Last 3 Encounters:  01/29/17 191 lb 12.8 oz (87 kg)  08/02/16 196 lb (88.9 kg)  02/04/16 197 lb 12.8 oz (89.7 kg)    Elderly male, appears comfortable at rest. HEENT: Conjunctiva and lids normal, oropharynx clear.  Neck: Supple, soft carotid bruits, no thyromegaly.  Lungs: Diminished breath sounds, nonlabored.   Cardiac: Regular rate and rhythm, no significant murmur or gallop.  Abdomen: Soft, nontender, bowel sounds present.  Skin: Warm and dry.  Musculoskeletal: No kyphosis. Neuropsychiatric: Alert and oriented 3, affect appropriate.  ECG: I personally reviewed the tracing from 02/04/2016 which showed sinus bradycardia with incomplete right bundle branch block, old inferior infarct, nonspecific T-wave changes.  Recent Labwork:  May 2017: Cholesterol 99, triglycerides 193, HDL 26, LDL 34, hemoglobin A1c 7.7, BUN 21, creatinine 1.27, potassium 4.8  Other Studies Reviewed Today:  Lexiscan Cardiolite 02/08/2015: FINDINGS: Baseline tracing shows sinus bradycardia with evidence of previous inferior infarct and nonspecific T-wave changes. Lexiscan bolus was given in standard fashion. Heart rate increased from 45 beats per minute up to 75 beats per minute, and blood pressure increased from 143/81 up to 169/81. No chest pain was reported. There were no diagnostic ST segment abnormalities or significant arrhythmias.  Analysis of the raw perfusion data finds adequate myocardial radiotracer uptake.  Perfusion: There is a moderate-sized, moderate intensity, apical to basal inferolateral defect that is fixed in most consistent with scar. No large ischemic zones noted.  Wall Motion: Mild basal lateral hypokinesis  Left Ventricular Ejection Fraction: 51 %  End diastolic volume 88 ml  End systolic volume 43 ml  TID ratio 1.08  IMPRESSION: 1. Inferolateral scar without large ischemic territories.  2. Mild basal lateral hypokinesis.  3. Left ventricular ejection fraction 51%  4. Overall low-risk stress test findings*.  Assessment and Plan:  1. Symptomatically stable CAD status post DES to the LAD and obtuse marginal in August 2009. Stress testing from within the last 2 years was overall low risk, and he does not report any progressive angina on medical therapy. Continue  observation.  2. Bradycardia. Reduce Coreg to 3.125 mg twice daily.  3. Essential hypertension, other then the reduction in Coreg, no other changes are being made. Keep follow-up with Dr. Sherrie Sport.  4. Hyperlipidemia, continues on Crestor, last LDL 34.  Current medicines were reviewed with the patient today.   Orders Placed This Encounter  Procedures  . EKG 12-Lead    Disposition: Follow-up in 6 months.  Signed, Satira Sark, MD, [facility] 01/29/2017 9:44 AM    [facility] [place], [place], [place] [address] Phone: [phone]; Fax: [phone]

## 2017-01-29 NOTE — Patient Instructions (Signed)
Medication Instructions:  Your physician has recommended you make the following change in your medication: Maury 3.125 MG [place] physician recommends that you continue on all your other medications as directed. Please refer to the Current Medication list given to you today.  Labwork: NONE   Testing/Procedures: NONE  Follow-Up: Your physician wants you to follow-up in: [place]. [place]. You will receive a reminder letter in the mail two months in advance. If you don't receive a letter, please call our office to schedule the follow-up appointment.  Any Other Special Instructions Will Be Listed Below (If Applicable).  If you need a refill on your cardiac medications before your next appointment, please call your pharmacy.

## 2017-02-08 DIAGNOSIS — C44229 Squamous cell carcinoma of skin of left ear and external auricular canal: Secondary | ICD-10-CM | POA: Diagnosis not present

## 2017-02-20 DIAGNOSIS — I10 Essential (primary) hypertension: Secondary | ICD-10-CM | POA: Diagnosis not present

## 2017-02-20 DIAGNOSIS — M150 Primary generalized (osteo)arthritis: Secondary | ICD-10-CM | POA: Diagnosis not present

## 2017-02-20 DIAGNOSIS — E119 Type 2 diabetes mellitus without complications: Secondary | ICD-10-CM | POA: Diagnosis not present

## 2017-02-22 DIAGNOSIS — C44629 Squamous cell carcinoma of skin of left upper limb, including shoulder: Secondary | ICD-10-CM | POA: Diagnosis not present

## 2017-02-22 DIAGNOSIS — C44329 Squamous cell carcinoma of skin of other parts of face: Secondary | ICD-10-CM | POA: Diagnosis not present

## 2017-03-26 DIAGNOSIS — I25118 Atherosclerotic heart disease of native coronary artery with other forms of angina pectoris: Secondary | ICD-10-CM | POA: Diagnosis not present

## 2017-03-26 DIAGNOSIS — E1122 Type 2 diabetes mellitus with diabetic chronic kidney disease: Secondary | ICD-10-CM | POA: Diagnosis not present

## 2017-03-26 DIAGNOSIS — K210 Gastro-esophageal reflux disease with esophagitis: Secondary | ICD-10-CM | POA: Diagnosis not present

## 2017-03-26 DIAGNOSIS — M12861 Other specific arthropathies, not elsewhere classified, right knee: Secondary | ICD-10-CM | POA: Diagnosis not present

## 2017-03-26 DIAGNOSIS — E784 Other hyperlipidemia: Secondary | ICD-10-CM | POA: Diagnosis not present

## 2017-05-08 DIAGNOSIS — E119 Type 2 diabetes mellitus without complications: Secondary | ICD-10-CM | POA: Diagnosis not present

## 2017-05-08 DIAGNOSIS — I10 Essential (primary) hypertension: Secondary | ICD-10-CM | POA: Diagnosis not present

## 2017-05-08 DIAGNOSIS — M158 Other polyosteoarthritis: Secondary | ICD-10-CM | POA: Diagnosis not present

## 2017-05-25 DIAGNOSIS — M158 Other polyosteoarthritis: Secondary | ICD-10-CM | POA: Diagnosis not present

## 2017-05-25 DIAGNOSIS — I10 Essential (primary) hypertension: Secondary | ICD-10-CM | POA: Diagnosis not present

## 2017-05-25 DIAGNOSIS — E119 Type 2 diabetes mellitus without complications: Secondary | ICD-10-CM | POA: Diagnosis not present

## 2017-06-20 DIAGNOSIS — M158 Other polyosteoarthritis: Secondary | ICD-10-CM | POA: Diagnosis not present

## 2017-06-20 DIAGNOSIS — I10 Essential (primary) hypertension: Secondary | ICD-10-CM | POA: Diagnosis not present

## 2017-06-20 DIAGNOSIS — E119 Type 2 diabetes mellitus without complications: Secondary | ICD-10-CM | POA: Diagnosis not present

## 2017-07-10 DIAGNOSIS — M12861 Other specific arthropathies, not elsewhere classified, right knee: Secondary | ICD-10-CM | POA: Diagnosis not present

## 2017-07-10 DIAGNOSIS — E784 Other hyperlipidemia: Secondary | ICD-10-CM | POA: Diagnosis not present

## 2017-07-10 DIAGNOSIS — I25118 Atherosclerotic heart disease of native coronary artery with other forms of angina pectoris: Secondary | ICD-10-CM | POA: Diagnosis not present

## 2017-07-10 DIAGNOSIS — Z1389 Encounter for screening for other disorder: Secondary | ICD-10-CM | POA: Diagnosis not present

## 2017-07-10 DIAGNOSIS — E1122 Type 2 diabetes mellitus with diabetic chronic kidney disease: Secondary | ICD-10-CM | POA: Diagnosis not present

## 2017-07-10 DIAGNOSIS — K210 Gastro-esophageal reflux disease with esophagitis: Secondary | ICD-10-CM | POA: Diagnosis not present

## 2017-07-10 DIAGNOSIS — Z0000 Encounter for general adult medical examination without abnormal findings: Secondary | ICD-10-CM | POA: Diagnosis not present

## 2017-07-23 ENCOUNTER — Ambulatory Visit (INDEPENDENT_AMBULATORY_CARE_PROVIDER_SITE_OTHER): Payer: Medicare Other | Admitting: Cardiology

## 2017-07-23 VITALS — BP 118/70 | HR 57 | Ht 70.0 in | Wt 193.1 lb

## 2017-07-23 DIAGNOSIS — I10 Essential (primary) hypertension: Secondary | ICD-10-CM | POA: Diagnosis not present

## 2017-07-23 DIAGNOSIS — E782 Mixed hyperlipidemia: Secondary | ICD-10-CM

## 2017-07-23 DIAGNOSIS — Z23 Encounter for immunization: Secondary | ICD-10-CM | POA: Diagnosis not present

## 2017-07-23 DIAGNOSIS — I2510 Atherosclerotic heart disease of native coronary artery without angina pectoris: Secondary | ICD-10-CM

## 2017-07-23 DIAGNOSIS — I6523 Occlusion and stenosis of bilateral carotid arteries: Secondary | ICD-10-CM

## 2017-07-23 NOTE — Patient Instructions (Signed)
Medication Instructions:  Your physician recommends that you continue on your current medications as directed. Please refer to the Current Medication list given to you today.  Labwork: NONE  Testing/Procedures: Your physician has requested that you have a carotid duplex. This test is an ultrasound of the carotid arteries in your neck. It looks at blood flow through these arteries that supply the brain with blood. Allow one hour for this exam. There are no restrictions or special instructions.  Follow-Up: Your physician wants you to follow-up in: 6 MONTHS WITH DR. [name] You will receive a reminder letter in the mail two months in advance. If you don't receive a letter, please call our office to schedule the follow-up appointment.  Any Other Special Instructions Will Be Listed Below (If Applicable).  If you need a refill on your cardiac medications before your next appointment, please call your pharmacy.

## 2017-07-23 NOTE — Progress Notes (Signed)
Cardiology Office Note  Date: 07/23/2017   ID: Robert Banda Neto Sr., DOB 11-15-1940, MRN [id number]  PCP: Neale Burly, MD  Primary Cardiologist: Rozann Lesches, MD   Chief Complaint  Patient presents with  . Coronary Artery Disease    History of Present Illness: Robert E Rotan Sr. is a 76 y.o. male last seen in April. He presents for a routine follow-up visit. Since last encounter he does not report any progressive chest pain or nitroglycerin use. Continues to perform all ADLs including yard work. He reports NYHA class II dyspnea, no palpitations or syncope.  At the last visit we cut back Coreg dose. Heart rate is better today. He is otherwise tolerating his present medical regimen as outlined below.  Stress testing from 2016 is outlined below.  Last carotid Dopplers were in 2015, less than 40% bilateral ICA stenoses. We discussed obtaining a follow-up study for reassessment.  He continues to follow with Dr. Sherrie Sport and does not report any interval health changes.  Past Medical History:  Diagnosis Date  . Carotid artery disease ([place])    50%, bilateral  by CTA 2011  . Coronary atherosclerosis of native coronary artery    DES to LAD/OM Aug 2009  . Essential hypertension, benign   . Mixed hyperlipidemia   . Myocardial infarction ([place]) 05/2008   Out of hospital late presentation inferoposterolateral myocardial infarction  . Sinus bradycardia   . Tick bite Aug. 2014   Leg  . Type 2 diabetes mellitus ([place])     History reviewed. No pertinent surgical history.  Current Outpatient Prescriptions  Medication Sig Dispense Refill  . aspirin 81 MG tablet Take 81 mg by mouth daily. Reported on 02/04/2016    . carvedilol (COREG) 3.125 MG tablet Take 1 tablet (3.125 mg total) by mouth 2 (two) times daily. 180 tablet 3  . dutasteride (AVODART) 0.5 MG capsule Take 0.5 mg by mouth daily.      Marland Kitchen glipiZIDE (GLUCOTROL) 10 MG tablet Take 10 mg by mouth daily before breakfast.    .  hydrochlorothiazide (HYDRODIURIL) 25 MG tablet Take 1 tablet (25 mg total) by mouth daily. 90 tablet 3  . loratadine (CLARITIN) 10 MG tablet Take 10 mg by mouth daily as needed.     Marland Kitchen losartan (COZAAR) 100 MG tablet TAKE ONE TABLET BY MOUTH ONCE DAILY 90 tablet 3  . metFORMIN (GLUCOPHAGE) 1000 MG tablet Take 1,000 mg by mouth daily with breakfast.    . nitroGLYCERIN (NITROSTAT) 0.4 MG SL tablet Place 1 tablet (0.4 mg total) under the tongue every 5 (five) minutes as needed for chest pain. If no relief after 3rd dose, proceed to ED 25 tablet 3  . omeprazole (PRILOSEC) 20 MG capsule Take 1 capsule by mouth daily.    . rosuvastatin (CRESTOR) 5 MG tablet Take 1 tablet by mouth daily.    Marland Kitchen zolpidem (AMBIEN) 5 MG tablet Take 5 mg by mouth daily as needed.       No current facility-administered medications for this visit.    Allergies:  Patient has no known allergies.   Social History: The patient  reports that he quit smoking about 33 years ago. His smoking use included Cigarettes. He started smoking about 61 years ago. He has a 40.00 pack-year smoking history. He quit smokeless tobacco use about 26 years ago. His smokeless tobacco use included Chew. He reports that he drinks alcohol. He reports that he does not use drugs.   ROS:  Please see  the history of present illness. Otherwise, complete review of systems is positive for none.  All other systems are reviewed and negative.   Physical Exam: VS:  BP 118/70   Pulse (!) 57   Ht 5\' 10"  (1.778 m)   Wt 193 lb 1.6 oz (87.6 kg)   SpO2 98%   BMI 27.71 kg/m , BMI Body mass index is 27.71 kg/m.  Wt Readings from Last 3 Encounters:  07/23/17 193 lb 1.6 oz (87.6 kg)  01/29/17 191 lb 12.8 oz (87 kg)  08/02/16 196 lb (88.9 kg)    General: Elderly male, appears comfortable at rest. HEENT: Conjunctiva and lids normal, oropharynx clear. Neck: Supple, no elevated JVP, soft carotid bruits, no thyromegaly. Lungs: Clear to auscultation, nonlabored  breathing at rest. Cardiac: Regular rate and rhythm, no S3 or significant systolic murmur, no pericardial rub. Abdomen: Soft, nontender, bowel sounds present. Extremities: No pitting edema, distal pulses 2+. Skin: Warm and dry. Musculoskeletal: No kyphosis. Neuropsychiatric: Alert and oriented x3, affect grossly appropriate.  ECG: I personally reviewed the tracing from 01/29/2017 which showed sinus bradycardia with low voltage, old inferoposterior infarct pattern, nonspecific ST-T changes.  Recent Labwork:  May 2017: Cholesterol 99, triglycerides 193, HDL 28, LDL 34, hemoglobin A1c 7.7, BUN 21, creatinine 1.27, potassium 4.8  Other Studies Reviewed Today:  Lexiscan Myoview 02/08/2015: IMPRESSION: 1. Inferolateral scar without large ischemic territories.  2. Mild basal lateral hypokinesis.  3. Left ventricular ejection fraction 51%  4. Overall low-risk stress test findings*.  Carotid Dopplers 01/08/2014: Less than 40% bilateral ICA stenoses.  Assessment and Plan:  1. CAD status post DES to the LAD and obtuse marginal in 2009. He underwent low risk stress testing in 2016 and remains clinically stable at this time on medical therapy. Continue observation.  2. Bilateral carotid artery disease, nonobstructive as of 2015. Follow-up carotid Dopplers will be obtained. Continue aspirin and statin.  3. Essential hypertension, blood pressure is well controlled today. No changes made to current regimen.  4. Hyperlipidemia, on Crestor. LDL well controlled per last lab work. Keep follow-up with Dr. Sherrie Sport.  Current medicines were reviewed with the patient today.   Disposition: Follow-up in 6 months.  Signed, Satira Sark, MD, [facility] 07/23/2017 9:04 AM    [facility] [place], [place], [place] [address] Phone: [phone]; Fax: [phone]

## 2017-07-25 ENCOUNTER — Other Ambulatory Visit: Payer: Self-pay | Admitting: Cardiology

## 2017-07-25 DIAGNOSIS — I6523 Occlusion and stenosis of bilateral carotid arteries: Secondary | ICD-10-CM

## 2017-08-02 ENCOUNTER — Ambulatory Visit (INDEPENDENT_AMBULATORY_CARE_PROVIDER_SITE_OTHER): Payer: Medicare Other

## 2017-08-02 DIAGNOSIS — I6523 Occlusion and stenosis of bilateral carotid arteries: Secondary | ICD-10-CM

## 2017-08-02 LAB — VAS US CAROTID
LCCADDIAS: -21 cm/s
LCCAPSYS: 142 cm/s
LEFT ECA DIAS: -7 cm/s
LEFT VERTEBRAL DIAS: -10 cm/s
LICADSYS: -106 cm/s
Left CCA dist sys: -86 cm/s
Left CCA prox dias: 23 cm/s
Left ICA dist dias: -30 cm/s
Left ICA prox dias: 46 cm/s
Left ICA prox sys: 210 cm/s
RCCADSYS: -138 cm/s
RIGHT ECA DIAS: -11 cm/s
RIGHT VERTEBRAL DIAS: -11 cm/s
Right CCA prox dias: 21 cm/s
Right CCA prox sys: 120 cm/s

## 2017-08-03 ENCOUNTER — Telehealth: Payer: Self-pay | Admitting: *Deleted

## 2017-08-03 ENCOUNTER — Encounter: Payer: Self-pay | Admitting: *Deleted

## 2017-08-03 DIAGNOSIS — I739 Peripheral vascular disease, unspecified: Secondary | ICD-10-CM

## 2017-08-03 DIAGNOSIS — I6523 Occlusion and stenosis of bilateral carotid arteries: Secondary | ICD-10-CM

## 2017-08-03 DIAGNOSIS — I779 Disorder of arteries and arterioles, unspecified: Secondary | ICD-10-CM | POA: Insufficient documentation

## 2017-08-03 NOTE — Telephone Encounter (Signed)
Wife informed and copy sent to PCP 

## 2017-08-03 NOTE — Telephone Encounter (Signed)
-----   Message from Satira Sark, MD sent at 08/03/2017  7:05 AM EDT ----- Results reviewed. Carotid Dopplers show 1-39% RICA stenosis and 59-45% LICA stenosis. Would continue with medical therapy. Arrange follow-up study for one year. A copy of this test should be forwarded to Neale Burly, MD.

## 2017-08-14 DIAGNOSIS — I25118 Atherosclerotic heart disease of native coronary artery with other forms of angina pectoris: Secondary | ICD-10-CM | POA: Diagnosis not present

## 2017-08-14 DIAGNOSIS — K210 Gastro-esophageal reflux disease with esophagitis: Secondary | ICD-10-CM | POA: Diagnosis not present

## 2017-08-14 DIAGNOSIS — E1122 Type 2 diabetes mellitus with diabetic chronic kidney disease: Secondary | ICD-10-CM | POA: Diagnosis not present

## 2017-08-14 DIAGNOSIS — E7849 Other hyperlipidemia: Secondary | ICD-10-CM | POA: Diagnosis not present

## 2017-08-23 DIAGNOSIS — D485 Neoplasm of uncertain behavior of skin: Secondary | ICD-10-CM | POA: Diagnosis not present

## 2017-08-23 DIAGNOSIS — D0462 Carcinoma in situ of skin of left upper limb, including shoulder: Secondary | ICD-10-CM | POA: Diagnosis not present

## 2017-08-23 DIAGNOSIS — L570 Actinic keratosis: Secondary | ICD-10-CM | POA: Diagnosis not present

## 2017-08-23 DIAGNOSIS — Z85828 Personal history of other malignant neoplasm of skin: Secondary | ICD-10-CM | POA: Diagnosis not present

## 2017-08-29 DIAGNOSIS — D0462 Carcinoma in situ of skin of left upper limb, including shoulder: Secondary | ICD-10-CM | POA: Diagnosis not present

## 2017-08-29 DIAGNOSIS — C44629 Squamous cell carcinoma of skin of left upper limb, including shoulder: Secondary | ICD-10-CM | POA: Diagnosis not present

## 2017-09-04 DIAGNOSIS — E1122 Type 2 diabetes mellitus with diabetic chronic kidney disease: Secondary | ICD-10-CM | POA: Diagnosis not present

## 2017-09-04 DIAGNOSIS — I10 Essential (primary) hypertension: Secondary | ICD-10-CM | POA: Diagnosis not present

## 2017-10-03 DIAGNOSIS — E1122 Type 2 diabetes mellitus with diabetic chronic kidney disease: Secondary | ICD-10-CM | POA: Diagnosis not present

## 2017-10-03 DIAGNOSIS — I10 Essential (primary) hypertension: Secondary | ICD-10-CM | POA: Diagnosis not present

## 2017-10-05 ENCOUNTER — Other Ambulatory Visit: Payer: Self-pay | Admitting: Cardiology

## 2017-10-15 DIAGNOSIS — I25118 Atherosclerotic heart disease of native coronary artery with other forms of angina pectoris: Secondary | ICD-10-CM | POA: Diagnosis not present

## 2017-10-15 DIAGNOSIS — M12861 Other specific arthropathies, not elsewhere classified, right knee: Secondary | ICD-10-CM | POA: Diagnosis not present

## 2017-10-15 DIAGNOSIS — N401 Enlarged prostate with lower urinary tract symptoms: Secondary | ICD-10-CM | POA: Diagnosis not present

## 2017-10-15 DIAGNOSIS — I10 Essential (primary) hypertension: Secondary | ICD-10-CM | POA: Diagnosis not present

## 2017-10-15 DIAGNOSIS — E7849 Other hyperlipidemia: Secondary | ICD-10-CM | POA: Diagnosis not present

## 2017-10-15 DIAGNOSIS — E1122 Type 2 diabetes mellitus with diabetic chronic kidney disease: Secondary | ICD-10-CM | POA: Diagnosis not present

## 2017-10-15 DIAGNOSIS — K210 Gastro-esophageal reflux disease with esophagitis: Secondary | ICD-10-CM | POA: Diagnosis not present

## 2017-10-15 DIAGNOSIS — N181 Chronic kidney disease, stage 1: Secondary | ICD-10-CM | POA: Diagnosis not present

## 2017-12-04 DIAGNOSIS — L570 Actinic keratosis: Secondary | ICD-10-CM | POA: Diagnosis not present

## 2017-12-25 DIAGNOSIS — K210 Gastro-esophageal reflux disease with esophagitis: Secondary | ICD-10-CM | POA: Diagnosis not present

## 2017-12-25 DIAGNOSIS — E1122 Type 2 diabetes mellitus with diabetic chronic kidney disease: Secondary | ICD-10-CM | POA: Diagnosis not present

## 2017-12-25 DIAGNOSIS — E7849 Other hyperlipidemia: Secondary | ICD-10-CM | POA: Diagnosis not present

## 2017-12-25 DIAGNOSIS — I10 Essential (primary) hypertension: Secondary | ICD-10-CM | POA: Diagnosis not present

## 2018-01-17 DIAGNOSIS — E1122 Type 2 diabetes mellitus with diabetic chronic kidney disease: Secondary | ICD-10-CM | POA: Diagnosis not present

## 2018-01-17 DIAGNOSIS — N401 Enlarged prostate with lower urinary tract symptoms: Secondary | ICD-10-CM | POA: Diagnosis not present

## 2018-01-17 DIAGNOSIS — M12861 Other specific arthropathies, not elsewhere classified, right knee: Secondary | ICD-10-CM | POA: Diagnosis not present

## 2018-01-17 DIAGNOSIS — N181 Chronic kidney disease, stage 1: Secondary | ICD-10-CM | POA: Diagnosis not present

## 2018-01-17 DIAGNOSIS — E7849 Other hyperlipidemia: Secondary | ICD-10-CM | POA: Diagnosis not present

## 2018-01-17 DIAGNOSIS — I10 Essential (primary) hypertension: Secondary | ICD-10-CM | POA: Diagnosis not present

## 2018-01-17 DIAGNOSIS — I25118 Atherosclerotic heart disease of native coronary artery with other forms of angina pectoris: Secondary | ICD-10-CM | POA: Diagnosis not present

## 2018-01-17 DIAGNOSIS — K210 Gastro-esophageal reflux disease with esophagitis: Secondary | ICD-10-CM | POA: Diagnosis not present

## 2018-01-22 DIAGNOSIS — E1122 Type 2 diabetes mellitus with diabetic chronic kidney disease: Secondary | ICD-10-CM | POA: Diagnosis not present

## 2018-01-22 DIAGNOSIS — E7849 Other hyperlipidemia: Secondary | ICD-10-CM | POA: Diagnosis not present

## 2018-01-22 DIAGNOSIS — I10 Essential (primary) hypertension: Secondary | ICD-10-CM | POA: Diagnosis not present

## 2018-01-30 ENCOUNTER — Encounter: Payer: Self-pay | Admitting: *Deleted

## 2018-01-30 NOTE — Progress Notes (Signed)
Cardiology Office Note  Date: 01/31/2018   ID: Robert Banda Wakefield Sr., DOB 09/09/1941, MRN [id number]  PCP: Neale Burly, MD  Primary Cardiologist: Rozann Lesches, MD   Chief Complaint  Patient presents with  . Coronary Artery Disease    History of Present Illness: Robert E Goins Sr. is a 77 y.o. male last seen in October 2018.  He presents for a follow-up visit.  Reports no angina symptoms or nitroglycerin use.  Has had no palpitations, dizziness, or syncope.  States that he has been taking his medications regularly.  Follow-up carotid Dopplers in October 2018 revealed 1-39% RICA stenosis and 47-09% LICA stenosis.  He is asymptomatic and remains on aspirin as well as Crestor.  I personally reviewed his ECG today which showed sinus bradycardia with PVC and prior inferolateral infarct pattern.  Follow-up ischemic testing from 2016 was low risk.  Past Medical History:  Diagnosis Date  . Carotid artery disease ([place])    50%, bilateral  by CTA 2011  . Coronary atherosclerosis of native coronary artery    DES to LAD/OM Aug 2009  . Essential hypertension, benign   . Mixed hyperlipidemia   . Myocardial infarction ([place]) 05/2008   Out of hospital late presentation inferoposterolateral myocardial infarction  . Sinus bradycardia   . Tick bite Aug. 2014   Leg  . Type 2 diabetes mellitus ([place])     History reviewed. No pertinent surgical history.  Current Outpatient Medications  Medication Sig Dispense Refill  . aspirin 81 MG tablet Take 81 mg by mouth daily. Reported on 02/04/2016    . carvedilol (COREG) 3.125 MG tablet Take 1 tablet (3.125 mg total) by mouth 2 (two) times daily. 180 tablet 3  . dutasteride (AVODART) 0.5 MG capsule Take 0.5 mg by mouth daily.      Marland Kitchen glipiZIDE (GLUCOTROL) 10 MG tablet Take 10 mg by mouth daily before breakfast.    . hydrochlorothiazide (HYDRODIURIL) 25 MG tablet Take 1 tablet (25 mg total) by mouth daily. 90 tablet 3  . loratadine (CLARITIN) 10  MG tablet Take 10 mg by mouth daily as needed.     Marland Kitchen losartan (COZAAR) 100 MG tablet TAKE ONE TABLET BY MOUTH ONCE DAILY 90 tablet 3  . metFORMIN (GLUCOPHAGE) 1000 MG tablet Take 1,000 mg by mouth daily with breakfast.    . nitroGLYCERIN (NITROSTAT) 0.4 MG SL tablet Place 1 tablet (0.4 mg total) under the tongue every 5 (five) minutes as needed for chest pain. If no relief after 3rd dose, proceed to ED 25 tablet 3  . omeprazole (PRILOSEC) 20 MG capsule Take 1 capsule by mouth daily.    . rosuvastatin (CRESTOR) 5 MG tablet Take 1 tablet by mouth daily.    Marland Kitchen zolpidem (AMBIEN) 5 MG tablet Take 5 mg by mouth daily as needed.       No current facility-administered medications for this visit.    Allergies:  Patient has no known allergies.   Social History: The patient  reports that he quit smoking about 34 years ago. His smoking use included cigarettes. He started smoking about 61 years ago. He has a 40.00 pack-year smoking history. He quit smokeless tobacco use about 27 years ago. His smokeless tobacco use included chew. He reports that he drinks alcohol. He reports that he does not use drugs.   ROS:  Please see the history of present illness. Otherwise, complete review of systems is positive for none.  All other systems are reviewed and negative.  Physical Exam: VS:  BP 118/64   Pulse (!) 54   Ht 5\' 10"  (1.778 m)   Wt 193 lb 9.6 oz (87.8 kg)   SpO2 96%   BMI 27.78 kg/m , BMI Body mass index is 27.78 kg/m.  Wt Readings from Last 3 Encounters:  01/31/18 193 lb 9.6 oz (87.8 kg)  07/23/17 193 lb 1.6 oz (87.6 kg)  01/29/17 191 lb 12.8 oz (87 kg)    General: Elderly male, appears comfortable at rest. HEENT: Conjunctiva and lids normal, oropharynx clear. Neck: Supple, no elevated JVP, soft carotid bruits, no thyromegaly. Lungs: Clear to auscultation, nonlabored breathing at rest. Cardiac: Regular rate and rhythm, no S3 or significant systolic murmur. Abdomen: Soft, nontender, bowel sounds  present. Extremities: No pitting edema, distal pulses 2+. Skin: Warm and dry. Musculoskeletal: No kyphosis. Neuropsychiatric: Alert and oriented x3, affect grossly appropriate.  ECG: I personally reviewed the tracing from 01/29/2017 which showed sinus bradycardia with low voltage, probable old inferior infarct pattern, and nonspecific T wave changes.  Other Studies Reviewed Today:  Carotid Dopplers 08/02/2017: 1-39% RICA stenosis and 41-28% LICA stenosis.  Lexiscan Myoview 02/08/2015: IMPRESSION: 1. Inferolateral scar without large ischemic territories.  2. Mild basal lateral hypokinesis.  3. Left ventricular ejection fraction 51%  4. Overall low-risk stress test findings*.  Assessment and Plan:  1.  CAD with history of DES to the LAD and obtuse marginal in 2009.  He reports no angina on medical therapy and we will continue with observation for now.  Last ischemic testing was low risk and 2016.  ECG reviewed and consistent with previous infarct.  2.  Bilateral carotid artery disease, asymptomatic on aspirin and Crestor.  Follow-up carotid Dopplers will be arranged for later in the year.  3.  Essential hypertension, blood pressure well controlled today.  4.  Mixed hyperlipidemia, remains on Crestor with follow-up per Dr. Sherrie Sport.  Current medicines were reviewed with the patient today.   Orders Placed This Encounter  Procedures  . EKG 12-Lead    Disposition: Follow-up in 6 months.  Signed, Satira Sark, MD, [facility] 01/31/2018 9:40 AM    [facility] at [place], [place], [place] [address] Phone: [phone]; Fax: [phone]

## 2018-01-31 ENCOUNTER — Encounter: Payer: Self-pay | Admitting: Cardiology

## 2018-01-31 ENCOUNTER — Ambulatory Visit (INDEPENDENT_AMBULATORY_CARE_PROVIDER_SITE_OTHER): Payer: Medicare Other | Admitting: Cardiology

## 2018-01-31 VITALS — BP 118/64 | HR 54 | Ht 70.0 in | Wt 193.6 lb

## 2018-01-31 DIAGNOSIS — I25119 Atherosclerotic heart disease of native coronary artery with unspecified angina pectoris: Secondary | ICD-10-CM

## 2018-01-31 DIAGNOSIS — I6523 Occlusion and stenosis of bilateral carotid arteries: Secondary | ICD-10-CM

## 2018-01-31 DIAGNOSIS — E782 Mixed hyperlipidemia: Secondary | ICD-10-CM

## 2018-01-31 DIAGNOSIS — I10 Essential (primary) hypertension: Secondary | ICD-10-CM | POA: Diagnosis not present

## 2018-01-31 NOTE — Patient Instructions (Signed)

## 2018-02-25 ENCOUNTER — Other Ambulatory Visit: Payer: Self-pay | Admitting: *Deleted

## 2018-02-25 MED ORDER — NITROGLYCERIN 0.4 MG SL SUBL: 0.4000 mg | SUBLINGUAL_TABLET | SUBLINGUAL | 3 refills | Status: DC | PRN

## 2018-04-19 DIAGNOSIS — M12861 Other specific arthropathies, not elsewhere classified, right knee: Secondary | ICD-10-CM | POA: Diagnosis not present

## 2018-04-19 DIAGNOSIS — E7849 Other hyperlipidemia: Secondary | ICD-10-CM | POA: Diagnosis not present

## 2018-04-19 DIAGNOSIS — E1122 Type 2 diabetes mellitus with diabetic chronic kidney disease: Secondary | ICD-10-CM | POA: Diagnosis not present

## 2018-04-19 DIAGNOSIS — N181 Chronic kidney disease, stage 1: Secondary | ICD-10-CM | POA: Diagnosis not present

## 2018-04-19 DIAGNOSIS — I10 Essential (primary) hypertension: Secondary | ICD-10-CM | POA: Diagnosis not present

## 2018-04-19 DIAGNOSIS — K210 Gastro-esophageal reflux disease with esophagitis: Secondary | ICD-10-CM | POA: Diagnosis not present

## 2018-04-19 DIAGNOSIS — I25118 Atherosclerotic heart disease of native coronary artery with other forms of angina pectoris: Secondary | ICD-10-CM | POA: Diagnosis not present

## 2018-04-19 DIAGNOSIS — N401 Enlarged prostate with lower urinary tract symptoms: Secondary | ICD-10-CM | POA: Diagnosis not present

## 2018-05-28 DIAGNOSIS — I10 Essential (primary) hypertension: Secondary | ICD-10-CM | POA: Diagnosis not present

## 2018-05-28 DIAGNOSIS — N401 Enlarged prostate with lower urinary tract symptoms: Secondary | ICD-10-CM | POA: Diagnosis not present

## 2018-05-28 DIAGNOSIS — K210 Gastro-esophageal reflux disease with esophagitis: Secondary | ICD-10-CM | POA: Diagnosis not present

## 2018-05-28 DIAGNOSIS — M12861 Other specific arthropathies, not elsewhere classified, right knee: Secondary | ICD-10-CM | POA: Diagnosis not present

## 2018-05-28 DIAGNOSIS — E7849 Other hyperlipidemia: Secondary | ICD-10-CM | POA: Diagnosis not present

## 2018-05-28 DIAGNOSIS — E1122 Type 2 diabetes mellitus with diabetic chronic kidney disease: Secondary | ICD-10-CM | POA: Diagnosis not present

## 2018-05-28 DIAGNOSIS — I25118 Atherosclerotic heart disease of native coronary artery with other forms of angina pectoris: Secondary | ICD-10-CM | POA: Diagnosis not present

## 2018-05-28 DIAGNOSIS — N181 Chronic kidney disease, stage 1: Secondary | ICD-10-CM | POA: Diagnosis not present

## 2018-06-24 DIAGNOSIS — N181 Chronic kidney disease, stage 1: Secondary | ICD-10-CM | POA: Diagnosis not present

## 2018-06-24 DIAGNOSIS — K210 Gastro-esophageal reflux disease with esophagitis: Secondary | ICD-10-CM | POA: Diagnosis not present

## 2018-06-24 DIAGNOSIS — N401 Enlarged prostate with lower urinary tract symptoms: Secondary | ICD-10-CM | POA: Diagnosis not present

## 2018-06-24 DIAGNOSIS — E7849 Other hyperlipidemia: Secondary | ICD-10-CM | POA: Diagnosis not present

## 2018-06-24 DIAGNOSIS — I25118 Atherosclerotic heart disease of native coronary artery with other forms of angina pectoris: Secondary | ICD-10-CM | POA: Diagnosis not present

## 2018-06-24 DIAGNOSIS — I10 Essential (primary) hypertension: Secondary | ICD-10-CM | POA: Diagnosis not present

## 2018-06-24 DIAGNOSIS — M12861 Other specific arthropathies, not elsewhere classified, right knee: Secondary | ICD-10-CM | POA: Diagnosis not present

## 2018-06-24 DIAGNOSIS — E1122 Type 2 diabetes mellitus with diabetic chronic kidney disease: Secondary | ICD-10-CM | POA: Diagnosis not present

## 2018-07-23 DIAGNOSIS — E1122 Type 2 diabetes mellitus with diabetic chronic kidney disease: Secondary | ICD-10-CM | POA: Diagnosis not present

## 2018-07-23 DIAGNOSIS — I10 Essential (primary) hypertension: Secondary | ICD-10-CM | POA: Diagnosis not present

## 2018-07-23 DIAGNOSIS — K210 Gastro-esophageal reflux disease with esophagitis: Secondary | ICD-10-CM | POA: Diagnosis not present

## 2018-07-23 DIAGNOSIS — N181 Chronic kidney disease, stage 1: Secondary | ICD-10-CM | POA: Diagnosis not present

## 2018-07-23 DIAGNOSIS — N401 Enlarged prostate with lower urinary tract symptoms: Secondary | ICD-10-CM | POA: Diagnosis not present

## 2018-07-23 DIAGNOSIS — M12861 Other specific arthropathies, not elsewhere classified, right knee: Secondary | ICD-10-CM | POA: Diagnosis not present

## 2018-07-23 DIAGNOSIS — E7849 Other hyperlipidemia: Secondary | ICD-10-CM | POA: Diagnosis not present

## 2018-07-23 DIAGNOSIS — I25118 Atherosclerotic heart disease of native coronary artery with other forms of angina pectoris: Secondary | ICD-10-CM | POA: Diagnosis not present

## 2018-08-01 ENCOUNTER — Ambulatory Visit (INDEPENDENT_AMBULATORY_CARE_PROVIDER_SITE_OTHER): Payer: Medicare Other

## 2018-08-01 DIAGNOSIS — I6523 Occlusion and stenosis of bilateral carotid arteries: Secondary | ICD-10-CM | POA: Diagnosis not present

## 2018-08-05 ENCOUNTER — Telehealth: Payer: Self-pay | Admitting: *Deleted

## 2018-08-05 DIAGNOSIS — I779 Disorder of arteries and arterioles, unspecified: Secondary | ICD-10-CM

## 2018-08-05 DIAGNOSIS — E1122 Type 2 diabetes mellitus with diabetic chronic kidney disease: Secondary | ICD-10-CM | POA: Diagnosis not present

## 2018-08-05 DIAGNOSIS — M12861 Other specific arthropathies, not elsewhere classified, right knee: Secondary | ICD-10-CM | POA: Diagnosis not present

## 2018-08-05 DIAGNOSIS — N181 Chronic kidney disease, stage 1: Secondary | ICD-10-CM | POA: Diagnosis not present

## 2018-08-05 DIAGNOSIS — Z1389 Encounter for screening for other disorder: Secondary | ICD-10-CM | POA: Diagnosis not present

## 2018-08-05 DIAGNOSIS — Z0000 Encounter for general adult medical examination without abnormal findings: Secondary | ICD-10-CM | POA: Diagnosis not present

## 2018-08-05 DIAGNOSIS — E7849 Other hyperlipidemia: Secondary | ICD-10-CM | POA: Diagnosis not present

## 2018-08-05 DIAGNOSIS — K210 Gastro-esophageal reflux disease with esophagitis: Secondary | ICD-10-CM | POA: Diagnosis not present

## 2018-08-05 DIAGNOSIS — I25118 Atherosclerotic heart disease of native coronary artery with other forms of angina pectoris: Secondary | ICD-10-CM | POA: Diagnosis not present

## 2018-08-05 DIAGNOSIS — I10 Essential (primary) hypertension: Secondary | ICD-10-CM | POA: Diagnosis not present

## 2018-08-05 DIAGNOSIS — G4701 Insomnia due to medical condition: Secondary | ICD-10-CM | POA: Diagnosis not present

## 2018-08-05 DIAGNOSIS — N401 Enlarged prostate with lower urinary tract symptoms: Secondary | ICD-10-CM | POA: Diagnosis not present

## 2018-08-05 DIAGNOSIS — I739 Peripheral vascular disease, unspecified: Principal | ICD-10-CM

## 2018-08-05 NOTE — Telephone Encounter (Signed)
-----   Message from Satira Sark, MD sent at 08/04/2018  2:01 PM EDT ----- Results reviewed. Stable 1-39% bilatereal ICA stenoses - continue ASA and Crestor. Can follow-up Carotid Dopplers in 1 year. A copy of this test should be forwarded to Neale Burly, MD.

## 2018-08-05 NOTE — Telephone Encounter (Signed)
Wife informed and copy sent to PCP 

## 2018-08-06 DIAGNOSIS — D485 Neoplasm of uncertain behavior of skin: Secondary | ICD-10-CM | POA: Diagnosis not present

## 2018-08-06 DIAGNOSIS — L578 Other skin changes due to chronic exposure to nonionizing radiation: Secondary | ICD-10-CM | POA: Diagnosis not present

## 2018-08-06 DIAGNOSIS — L570 Actinic keratosis: Secondary | ICD-10-CM | POA: Diagnosis not present

## 2018-08-06 DIAGNOSIS — C44622 Squamous cell carcinoma of skin of right upper limb, including shoulder: Secondary | ICD-10-CM | POA: Diagnosis not present

## 2018-08-06 DIAGNOSIS — C44629 Squamous cell carcinoma of skin of left upper limb, including shoulder: Secondary | ICD-10-CM | POA: Diagnosis not present

## 2018-08-20 DIAGNOSIS — E1122 Type 2 diabetes mellitus with diabetic chronic kidney disease: Secondary | ICD-10-CM | POA: Diagnosis not present

## 2018-08-20 DIAGNOSIS — K210 Gastro-esophageal reflux disease with esophagitis: Secondary | ICD-10-CM | POA: Diagnosis not present

## 2018-08-20 DIAGNOSIS — E7849 Other hyperlipidemia: Secondary | ICD-10-CM | POA: Diagnosis not present

## 2018-08-20 DIAGNOSIS — I10 Essential (primary) hypertension: Secondary | ICD-10-CM | POA: Diagnosis not present

## 2018-08-23 ENCOUNTER — Encounter: Payer: Self-pay | Admitting: *Deleted

## 2018-08-23 NOTE — Progress Notes (Signed)
Cardiology Office Note  Date: 08/26/2018   ID: Robert Banda Kooi Sr., DOB 01/28/1941, MRN [id number]  PCP: Neale Burly, MD  Primary Cardiologist: Rozann Lesches, MD   Chief Complaint  Patient presents with  . Coronary Artery Disease    History of Present Illness: Robert E Caruthers Sr. is a 77 y.o. male last seen in April.  He is here for a routine visit.  He has had no angina symptoms and has not taken any nitroglycerin, in fact lost his last bottle.  He reports compliance with his medications which are stable from a cardiac perspective and outlined below.  Recent follow-up carotid Dopplers showed 1 to 39% bilateral ICA stenoses.  He is asymptomatic and remains on aspirin and Crestor.  He continues to follow with Dr. Sherrie Sport.  States that he has been working on glucose control with medication adjustments.  Past Medical History:  Diagnosis Date  . Carotid artery disease ([place])    50%, bilateral  by CTA 2011  . Coronary atherosclerosis of native coronary artery    DES to LAD/OM Aug 2009  . Essential hypertension, benign   . Mixed hyperlipidemia   . Myocardial infarction ([place]) 05/2008   Out of hospital late presentation inferoposterolateral myocardial infarction  . Sinus bradycardia   . Tick bite Aug. 2014   Leg  . Type 2 diabetes mellitus ([place])     History reviewed. No pertinent surgical history.  Current Outpatient Medications  Medication Sig Dispense Refill  . acarbose (PRECOSE) 100 MG tablet Take 100 mg by mouth 3 (three) times daily with meals.    Marland Kitchen aspirin 81 MG tablet Take 81 mg by mouth daily. Reported on 02/04/2016    . dutasteride (AVODART) 0.5 MG capsule Take 0.5 mg by mouth daily.      Marland Kitchen glipiZIDE (GLUCOTROL) 10 MG tablet Take 10 mg by mouth daily before breakfast.    . hydrochlorothiazide (HYDRODIURIL) 25 MG tablet Take 1 tablet (25 mg total) by mouth daily. 90 tablet 3  . loratadine (CLARITIN) 10 MG tablet Take 10 mg by mouth daily as needed.     Marland Kitchen  losartan (COZAAR) 100 MG tablet TAKE ONE TABLET BY MOUTH ONCE DAILY 90 tablet 3  . metFORMIN (GLUCOPHAGE) 1000 MG tablet Take 1,000 mg by mouth daily with breakfast.    . nitroGLYCERIN (NITROSTAT) 0.4 MG SL tablet Place 1 tablet (0.4 mg total) under the tongue every 5 (five) minutes as needed for chest pain. If no relief after 3rd dose, proceed to ED 25 tablet 3  . omeprazole (PRILOSEC) 20 MG capsule Take 1 capsule by mouth daily.    . rosuvastatin (CRESTOR) 5 MG tablet Take 1 tablet by mouth daily.    Marland Kitchen zolpidem (AMBIEN) 5 MG tablet Take 5 mg by mouth daily as needed.      . carvedilol (COREG) 3.125 MG tablet Take 1 tablet (3.125 mg total) by mouth 2 (two) times daily. 180 tablet 3   No current facility-administered medications for this visit.    Allergies:  Patient has no known allergies.   Social History: The patient  reports that he quit smoking about 34 years ago. His smoking use included cigarettes. He started smoking about 62 years ago. He has a 40.00 pack-year smoking history. He quit smokeless tobacco use about 27 years ago.  His smokeless tobacco use included chew. He reports that he drinks alcohol. He reports that he does not use drugs.   ROS:  Please see the history  of present illness. Otherwise, complete review of systems is positive for none.  All other systems are reviewed and negative.   Physical Exam: VS:  BP 138/70   Pulse (!) 58   Ht 5\' 10"  (1.778 m)   Wt 198 lb (89.8 kg)   SpO2 96%   BMI 28.41 kg/m , BMI Body mass index is 28.41 kg/m.  Wt Readings from Last 3 Encounters:  08/26/18 198 lb (89.8 kg)  01/31/18 193 lb 9.6 oz (87.8 kg)  07/23/17 193 lb 1.6 oz (87.6 kg)    General: Elderly male, appears comfortable at rest. HEENT: Conjunctiva and lids normal, oropharynx clear. Neck: Supple, no elevated JVP, soft carotid bruits, no thyromegaly. Lungs: Clear to auscultation, nonlabored breathing at rest. Cardiac: Regular rate and rhythm, no S3 or significant systolic  murmur. Abdomen: Soft, nontender, bowel sounds present. Extremities: No pitting edema, distal pulses 2+. Skin: Warm and dry. Musculoskeletal: No kyphosis. Neuropsychiatric: Alert and oriented x3, affect grossly appropriate.  ECG: I personally reviewed the tracing from 01/31/2018 which showed sinus bradycardia with PVC and prior anterolateral infarct pattern.  Other Studies Reviewed Today:  Carotid Dopplers 08/01/2018: Summary: Right Carotid: Velocities in the right ICA are consistent with a 1-39% stenosis.  Left Carotid: Velocities in the left ICA are consistent with a 1-39% stenosis.  Vertebrals: Bilateral vertebral arteries demonstrate antegrade flow. Subclavians: Normal flow hemodynamics were seen in bilateral subclavian       arteries.  Lexiscan Myoview 02/08/2015: IMPRESSION: 1. Inferolateral scar without large ischemic territories.  2. Mild basal lateral hypokinesis.  3. Left ventricular ejection fraction 51%  4. Overall low-risk stress test findings*.  Assessment and Plan:  1.  CAD status post DES to the LAD and obtuse marginal in 2009.  He remains symptom-free in terms of angina on medical therapy.  Refill provided for nitroglycerin.  Last ischemic evaluation was in 2016.  He remains comfortable with observation at this time.  2.  Mild, bilateral carotid artery disease.  He is asymptomatic and continues on aspirin and Crestor.  3.  Essential hypertension, blood pressure is adequately controlled today.  4.  Mixed hyperlipidemia on Crestor.  He follows lab work with Dr. Sherrie Sport.  Current medicines were reviewed with the patient today.  Disposition: Follow-up in 6 months.  Signed, Satira Sark, MD, [facility] 08/26/2018 9:26 AM    [facility] at [place], [place], [place] [address] Phone: [phone]; Fax: [phone]

## 2018-08-26 ENCOUNTER — Ambulatory Visit (INDEPENDENT_AMBULATORY_CARE_PROVIDER_SITE_OTHER): Payer: Medicare Other | Admitting: Cardiology

## 2018-08-26 ENCOUNTER — Encounter: Payer: Self-pay | Admitting: Cardiology

## 2018-08-26 VITALS — BP 138/70 | HR 58 | Ht 70.0 in | Wt 198.0 lb

## 2018-08-26 DIAGNOSIS — I10 Essential (primary) hypertension: Secondary | ICD-10-CM | POA: Diagnosis not present

## 2018-08-26 DIAGNOSIS — I25119 Atherosclerotic heart disease of native coronary artery with unspecified angina pectoris: Secondary | ICD-10-CM | POA: Diagnosis not present

## 2018-08-26 DIAGNOSIS — I6523 Occlusion and stenosis of bilateral carotid arteries: Secondary | ICD-10-CM

## 2018-08-26 DIAGNOSIS — E782 Mixed hyperlipidemia: Secondary | ICD-10-CM | POA: Diagnosis not present

## 2018-08-26 MED ORDER — NITROGLYCERIN 0.4 MG SL SUBL: 0.4000 mg | SUBLINGUAL_TABLET | SUBLINGUAL | 3 refills | Status: AC | PRN

## 2018-08-26 NOTE — Patient Instructions (Signed)

## 2018-09-04 ENCOUNTER — Other Ambulatory Visit: Payer: Self-pay | Admitting: Cardiology

## 2018-09-04 DIAGNOSIS — Z23 Encounter for immunization: Secondary | ICD-10-CM | POA: Diagnosis not present

## 2018-09-25 DIAGNOSIS — L570 Actinic keratosis: Secondary | ICD-10-CM | POA: Diagnosis not present

## 2018-09-25 DIAGNOSIS — D0462 Carcinoma in situ of skin of left upper limb, including shoulder: Secondary | ICD-10-CM | POA: Diagnosis not present

## 2018-09-27 DIAGNOSIS — I10 Essential (primary) hypertension: Secondary | ICD-10-CM | POA: Diagnosis not present

## 2018-09-27 DIAGNOSIS — E7849 Other hyperlipidemia: Secondary | ICD-10-CM | POA: Diagnosis not present

## 2018-09-27 DIAGNOSIS — K210 Gastro-esophageal reflux disease with esophagitis: Secondary | ICD-10-CM | POA: Diagnosis not present

## 2018-09-27 DIAGNOSIS — E1122 Type 2 diabetes mellitus with diabetic chronic kidney disease: Secondary | ICD-10-CM | POA: Diagnosis not present

## 2018-10-01 ENCOUNTER — Other Ambulatory Visit: Payer: Self-pay | Admitting: Cardiology

## 2018-10-22 DIAGNOSIS — E1122 Type 2 diabetes mellitus with diabetic chronic kidney disease: Secondary | ICD-10-CM | POA: Diagnosis not present

## 2018-10-22 DIAGNOSIS — E7849 Other hyperlipidemia: Secondary | ICD-10-CM | POA: Diagnosis not present

## 2018-10-22 DIAGNOSIS — K210 Gastro-esophageal reflux disease with esophagitis: Secondary | ICD-10-CM | POA: Diagnosis not present

## 2018-10-22 DIAGNOSIS — I10 Essential (primary) hypertension: Secondary | ICD-10-CM | POA: Diagnosis not present

## 2018-11-19 DIAGNOSIS — M12861 Other specific arthropathies, not elsewhere classified, right knee: Secondary | ICD-10-CM | POA: Diagnosis not present

## 2018-11-19 DIAGNOSIS — E1122 Type 2 diabetes mellitus with diabetic chronic kidney disease: Secondary | ICD-10-CM | POA: Diagnosis not present

## 2018-11-19 DIAGNOSIS — N181 Chronic kidney disease, stage 1: Secondary | ICD-10-CM | POA: Diagnosis not present

## 2018-11-19 DIAGNOSIS — N401 Enlarged prostate with lower urinary tract symptoms: Secondary | ICD-10-CM | POA: Diagnosis not present

## 2018-11-19 DIAGNOSIS — I10 Essential (primary) hypertension: Secondary | ICD-10-CM | POA: Diagnosis not present

## 2018-11-19 DIAGNOSIS — E7849 Other hyperlipidemia: Secondary | ICD-10-CM | POA: Diagnosis not present

## 2018-11-19 DIAGNOSIS — I25118 Atherosclerotic heart disease of native coronary artery with other forms of angina pectoris: Secondary | ICD-10-CM | POA: Diagnosis not present

## 2018-11-19 DIAGNOSIS — K210 Gastro-esophageal reflux disease with esophagitis: Secondary | ICD-10-CM | POA: Diagnosis not present

## 2018-11-20 DIAGNOSIS — L570 Actinic keratosis: Secondary | ICD-10-CM | POA: Diagnosis not present

## 2018-11-20 DIAGNOSIS — D1801 Hemangioma of skin and subcutaneous tissue: Secondary | ICD-10-CM | POA: Diagnosis not present

## 2018-11-20 DIAGNOSIS — D485 Neoplasm of uncertain behavior of skin: Secondary | ICD-10-CM | POA: Diagnosis not present

## 2018-11-20 DIAGNOSIS — C44629 Squamous cell carcinoma of skin of left upper limb, including shoulder: Secondary | ICD-10-CM | POA: Diagnosis not present

## 2018-11-20 DIAGNOSIS — C44622 Squamous cell carcinoma of skin of right upper limb, including shoulder: Secondary | ICD-10-CM | POA: Diagnosis not present

## 2018-11-20 DIAGNOSIS — L821 Other seborrheic keratosis: Secondary | ICD-10-CM | POA: Diagnosis not present

## 2018-11-20 DIAGNOSIS — L814 Other melanin hyperpigmentation: Secondary | ICD-10-CM | POA: Diagnosis not present

## 2018-11-20 DIAGNOSIS — C44722 Squamous cell carcinoma of skin of right lower limb, including hip: Secondary | ICD-10-CM | POA: Diagnosis not present

## 2018-11-22 DIAGNOSIS — E1122 Type 2 diabetes mellitus with diabetic chronic kidney disease: Secondary | ICD-10-CM | POA: Diagnosis not present

## 2018-11-22 DIAGNOSIS — E7849 Other hyperlipidemia: Secondary | ICD-10-CM | POA: Diagnosis not present

## 2018-11-22 DIAGNOSIS — K210 Gastro-esophageal reflux disease with esophagitis: Secondary | ICD-10-CM | POA: Diagnosis not present

## 2018-11-22 DIAGNOSIS — I10 Essential (primary) hypertension: Secondary | ICD-10-CM | POA: Diagnosis not present

## 2018-12-16 DIAGNOSIS — E7849 Other hyperlipidemia: Secondary | ICD-10-CM | POA: Diagnosis not present

## 2018-12-16 DIAGNOSIS — E1122 Type 2 diabetes mellitus with diabetic chronic kidney disease: Secondary | ICD-10-CM | POA: Diagnosis not present

## 2018-12-16 DIAGNOSIS — I10 Essential (primary) hypertension: Secondary | ICD-10-CM | POA: Diagnosis not present

## 2018-12-16 DIAGNOSIS — K210 Gastro-esophageal reflux disease with esophagitis: Secondary | ICD-10-CM | POA: Diagnosis not present

## 2018-12-26 DIAGNOSIS — C44629 Squamous cell carcinoma of skin of left upper limb, including shoulder: Secondary | ICD-10-CM | POA: Diagnosis not present

## 2018-12-26 DIAGNOSIS — C44722 Squamous cell carcinoma of skin of right lower limb, including hip: Secondary | ICD-10-CM | POA: Diagnosis not present

## 2019-01-01 ENCOUNTER — Other Ambulatory Visit: Payer: Self-pay | Admitting: Cardiology

## 2019-01-07 DIAGNOSIS — D0461 Carcinoma in situ of skin of right upper limb, including shoulder: Secondary | ICD-10-CM | POA: Diagnosis not present

## 2019-01-07 DIAGNOSIS — D485 Neoplasm of uncertain behavior of skin: Secondary | ICD-10-CM | POA: Diagnosis not present

## 2019-01-16 DIAGNOSIS — I10 Essential (primary) hypertension: Secondary | ICD-10-CM | POA: Diagnosis not present

## 2019-01-16 DIAGNOSIS — K210 Gastro-esophageal reflux disease with esophagitis: Secondary | ICD-10-CM | POA: Diagnosis not present

## 2019-01-16 DIAGNOSIS — E1122 Type 2 diabetes mellitus with diabetic chronic kidney disease: Secondary | ICD-10-CM | POA: Diagnosis not present

## 2019-01-16 DIAGNOSIS — E7849 Other hyperlipidemia: Secondary | ICD-10-CM | POA: Diagnosis not present

## 2019-02-14 DIAGNOSIS — K210 Gastro-esophageal reflux disease with esophagitis: Secondary | ICD-10-CM | POA: Diagnosis not present

## 2019-02-14 DIAGNOSIS — E1122 Type 2 diabetes mellitus with diabetic chronic kidney disease: Secondary | ICD-10-CM | POA: Diagnosis not present

## 2019-02-14 DIAGNOSIS — E7849 Other hyperlipidemia: Secondary | ICD-10-CM | POA: Diagnosis not present

## 2019-02-14 DIAGNOSIS — I10 Essential (primary) hypertension: Secondary | ICD-10-CM | POA: Diagnosis not present

## 2019-02-20 ENCOUNTER — Telehealth: Payer: Self-pay | Admitting: *Deleted

## 2019-02-20 NOTE — Telephone Encounter (Signed)
Pt verbalized consent for telehealth appt with Dr [name] on 02/26/19 and that insurance would be billed for the encounter.

## 2019-02-25 ENCOUNTER — Encounter: Payer: Self-pay | Admitting: *Deleted

## 2019-02-25 NOTE — Progress Notes (Signed)
Virtual Visit via Telephone Note   This visit type was conducted due to national recommendations for restrictions regarding the COVID-19 Pandemic (e.g. social distancing) in an effort to limit this patient's exposure and mitigate transmission in our community.  Due to his co-morbid illnesses, this patient is at least at moderate risk for complications without adequate follow up.  This format is felt to be most appropriate for this patient at this time.  The patient did not have access to video technology/had technical difficulties with video requiring transitioning to audio format only (telephone).  All issues noted in this document were discussed and addressed.  No physical exam could be performed with this format.  Please refer to the patient's chart for his  consent to telehealth for Robert [name].   Date:  02/26/2019   ID:  Robert Banda Heinecke Sr., DOB 1941-03-16, MRN [id number]  Patient Location: Home Provider Location: Office  PCP:  Neale Burly, MD  Cardiologist:  Rozann Lesches, MD  Evaluation Performed:  Follow-Up Visit  Chief Complaint:   Cardiac follow-up  History of Present Illness:    Robert Ferreri Trouten Sr. is a 78 y.o. male last seen in November 2019.  He did not have video access and we spoke by phone today.  He does not report any new angina symptoms or nitroglycerin use.  Remains functional with ADLs including yard work, he has been cutting his grass a lot recently.  He does state that he has been social distancing.  Interestingly, his daughter is the IT sales professional for [employer].  She has obviously been very busy recently.  We went over his cardiac medications which are outlined below.  He does have a fresh bottle of nitroglycerin.  He will be seeing his PCP in a few months for lab work.  No intolerances to Crestor.  Vital signs reviewed below, he has not been checking his blood pressure until just recently.  I asked him to keep a log of this for his next visit  with PCP in case additional medication adjustments were needed.  He has continued on HCTZ, Cozaar, and Coreg.  The patient does not have symptoms concerning for COVID-19 infection (fever, chills, cough, or new shortness of breath).    Past Medical History:  Diagnosis Date  . Carotid artery disease ([place])    50%, bilateral  by CTA 2011  . Coronary atherosclerosis of native coronary artery    DES to LAD/OM Aug 2009  . Essential hypertension, benign   . Mixed hyperlipidemia   . Myocardial infarction ([place]) 05/2008   Out of hospital late presentation inferoposterolateral myocardial infarction  . Sinus bradycardia   . Tick bite Aug. 2014   Leg  . Type 2 diabetes mellitus ([facility])    No past surgical history on file.   Current Meds  Medication Sig  . acarbose (PRECOSE) 100 MG tablet Take 100 mg by mouth 3 (three) times daily with meals.  Marland Kitchen aspirin 81 MG tablet Take 81 mg by mouth daily. Reported on 02/04/2016  . carvedilol (COREG) 3.125 MG tablet TAKE 1 TABLET BY MOUTH TWICE DAILY  . dutasteride (AVODART) 0.5 MG capsule Take 0.5 mg by mouth daily.    Marland Kitchen glipiZIDE (GLUCOTROL) 10 MG tablet Take 10 mg by mouth daily before breakfast.  . hydrochlorothiazide (HYDRODIURIL) 25 MG tablet Take 1 tablet (25 mg total) by mouth daily.  Marland Kitchen loratadine (CLARITIN) 10 MG tablet Take 10 mg by mouth daily as needed.   Marland Kitchen losartan (COZAAR)  100 MG tablet Take 1 tablet by mouth once daily  . metFORMIN (GLUCOPHAGE) 1000 MG tablet Take 1,000 mg by mouth daily with breakfast.  . nitroGLYCERIN (NITROSTAT) 0.4 MG SL tablet Place 1 tablet (0.4 mg total) under the tongue every 5 (five) minutes as needed for chest pain. If no relief after 3rd dose, proceed to ED  . omeprazole (PRILOSEC) 20 MG capsule Take 1 capsule by mouth daily.  . rosuvastatin (CRESTOR) 5 MG tablet Take 1 tablet by mouth daily.  Marland Kitchen zolpidem (AMBIEN) 5 MG tablet Take 5 mg by mouth daily as needed.       Allergies:   Patient has no known allergies.    Social History   Tobacco Use  . Smoking status: Former Smoker    Packs/day: 2.00    Years: 20.00    Pack years: 40.00    Types: Cigarettes    Start date: 02/28/1956    Last attempt to quit: 10/17/1983    Years since quitting: 35.3  . Smokeless tobacco: Former Systems developer    Types: Chew    Quit date: 10/16/1990  . Tobacco comment: quit 1992 chewing  Substance Use Topics  . Alcohol use: Yes    Alcohol/week: 0.0 standard drinks    Comment: 07/30/14- "A little bit"  . Drug use: No     Family Hx: The patient's family history includes Cancer in his father and mother; Coronary artery disease in an other family member.  ROS:   Please see the history of present illness.    All other systems reviewed and are negative.   Prior CV studies:   The following studies were reviewed today:  Carotid Dopplers 08/01/2018: Summary: Right Carotid: Velocities in the right ICA are consistent with a 1-39% stenosis.  Left Carotid: Velocities in the left ICA are consistent with a 1-39% stenosis.  Vertebrals: Bilateral vertebral arteries demonstrate antegrade flow. Subclavians: Normal flow hemodynamics were seen in bilateral subclavian       arteries.  Lexiscan Myoview 02/08/2015: IMPRESSION: 1. Inferolateral scar without large ischemic territories.  2. Mild basal lateral hypokinesis.  3. Left ventricular ejection fraction 51%  4. Overall low-risk stress test findings*.  Labs/Other Tests and Data Reviewed:    EKG:  An ECG dated 01/31/2018 was personally reviewed today and demonstrated:  Sinus bradycardia with PVC and prior anterolateral infarct pattern.  Recent Labs:  Pending with PCP.  Wt Readings from Last 3 Encounters:  02/26/19 187 lb (84.8 kg)  08/26/18 198 lb (89.8 kg)  01/31/18 193 lb 9.6 oz (87.8 kg)     Objective:    Vital Signs:  BP (!) 149/94   Pulse (!) 48   Ht 5\' 10"  (1.778 m)   Wt 187 lb (84.8 kg)   BMI 26.83 kg/m    Patient spoke in full sentences on the  phone, not short of breath. Voice tone and speech pattern normal. No audible wheezing.  ASSESSMENT & PLAN:    1.  CAD status post DES to the LAD and obtuse marginal in 2009.  He is doing well without active angina symptoms on medical therapy.  We will plan to continue with observation, his last ischemic testing was in 2016.  2.  Essential hypertension, blood pressure elevated today.  I asked him to keep a track of this for next follow-up with PCP.  Continue current doses of Cozaar, HCTZ, and Coreg.  3.  Sinus bradycardia, asymptomatic.  4.  Mixed hyperlipidemia, he continues on Crestor without intolerances.  Keep  follow-up with PCP for anticipated lab work.  5.  Asymptomatic, mild carotid artery disease.  Continue aspirin and statin.  COVID-19 Education: The signs and symptoms of COVID-19 were discussed with the patient and how to seek care for testing (follow up with PCP or arrange E-visit).  The importance of social distancing was discussed today.  Time:   Today, I have spent 5 minutes with the patient with telehealth technology discussing the above problems.     Medication Adjustments/Labs and Tests Ordered: Current medicines are reviewed at length with the patient today.  Concerns regarding medicines are outlined above.   Tests Ordered: No orders of the defined types were placed in this encounter.   Medication Changes: No orders of the defined types were placed in this encounter.   Disposition:  Follow up 6 months in [place] office.  Signed, Rozann Lesches, MD  02/26/2019 8:44 AM    [place]

## 2019-02-26 ENCOUNTER — Telehealth (INDEPENDENT_AMBULATORY_CARE_PROVIDER_SITE_OTHER): Payer: Medicare Other | Admitting: Cardiology

## 2019-02-26 ENCOUNTER — Encounter: Payer: Self-pay | Admitting: Cardiology

## 2019-02-26 VITALS — BP 149/94 | HR 48 | Ht 70.0 in | Wt 187.0 lb

## 2019-02-26 DIAGNOSIS — I25119 Atherosclerotic heart disease of native coronary artery with unspecified angina pectoris: Secondary | ICD-10-CM

## 2019-02-26 DIAGNOSIS — I6523 Occlusion and stenosis of bilateral carotid arteries: Secondary | ICD-10-CM | POA: Diagnosis not present

## 2019-02-26 DIAGNOSIS — E782 Mixed hyperlipidemia: Secondary | ICD-10-CM | POA: Diagnosis not present

## 2019-02-26 DIAGNOSIS — Z7189 Other specified counseling: Secondary | ICD-10-CM | POA: Diagnosis not present

## 2019-02-26 DIAGNOSIS — I10 Essential (primary) hypertension: Secondary | ICD-10-CM | POA: Diagnosis not present

## 2019-02-26 NOTE — Patient Instructions (Addendum)

## 2019-02-27 DIAGNOSIS — I25118 Atherosclerotic heart disease of native coronary artery with other forms of angina pectoris: Secondary | ICD-10-CM | POA: Diagnosis not present

## 2019-02-27 DIAGNOSIS — N189 Chronic kidney disease, unspecified: Secondary | ICD-10-CM | POA: Diagnosis not present

## 2019-02-27 DIAGNOSIS — E1122 Type 2 diabetes mellitus with diabetic chronic kidney disease: Secondary | ICD-10-CM | POA: Diagnosis not present

## 2019-02-27 DIAGNOSIS — E7849 Other hyperlipidemia: Secondary | ICD-10-CM | POA: Diagnosis not present

## 2019-02-27 DIAGNOSIS — M12861 Other specific arthropathies, not elsewhere classified, right knee: Secondary | ICD-10-CM | POA: Diagnosis not present

## 2019-02-27 DIAGNOSIS — I10 Essential (primary) hypertension: Secondary | ICD-10-CM | POA: Diagnosis not present

## 2019-02-27 DIAGNOSIS — K210 Gastro-esophageal reflux disease with esophagitis: Secondary | ICD-10-CM | POA: Diagnosis not present

## 2019-02-27 DIAGNOSIS — N401 Enlarged prostate with lower urinary tract symptoms: Secondary | ICD-10-CM | POA: Diagnosis not present

## 2019-02-27 DIAGNOSIS — N181 Chronic kidney disease, stage 1: Secondary | ICD-10-CM | POA: Diagnosis not present

## 2019-03-18 DIAGNOSIS — E1122 Type 2 diabetes mellitus with diabetic chronic kidney disease: Secondary | ICD-10-CM | POA: Diagnosis not present

## 2019-03-18 DIAGNOSIS — I10 Essential (primary) hypertension: Secondary | ICD-10-CM | POA: Diagnosis not present

## 2019-03-18 DIAGNOSIS — K210 Gastro-esophageal reflux disease with esophagitis: Secondary | ICD-10-CM | POA: Diagnosis not present

## 2019-03-18 DIAGNOSIS — E7849 Other hyperlipidemia: Secondary | ICD-10-CM | POA: Diagnosis not present

## 2019-03-28 ENCOUNTER — Other Ambulatory Visit: Payer: Self-pay | Admitting: Cardiology

## 2019-04-17 DIAGNOSIS — E1122 Type 2 diabetes mellitus with diabetic chronic kidney disease: Secondary | ICD-10-CM | POA: Diagnosis not present

## 2019-04-17 DIAGNOSIS — E7849 Other hyperlipidemia: Secondary | ICD-10-CM | POA: Diagnosis not present

## 2019-04-17 DIAGNOSIS — K210 Gastro-esophageal reflux disease with esophagitis: Secondary | ICD-10-CM | POA: Diagnosis not present

## 2019-04-17 DIAGNOSIS — I10 Essential (primary) hypertension: Secondary | ICD-10-CM | POA: Diagnosis not present

## 2019-05-20 DIAGNOSIS — I10 Essential (primary) hypertension: Secondary | ICD-10-CM | POA: Diagnosis not present

## 2019-05-20 DIAGNOSIS — K210 Gastro-esophageal reflux disease with esophagitis: Secondary | ICD-10-CM | POA: Diagnosis not present

## 2019-05-20 DIAGNOSIS — E1122 Type 2 diabetes mellitus with diabetic chronic kidney disease: Secondary | ICD-10-CM | POA: Diagnosis not present

## 2019-05-20 DIAGNOSIS — E7849 Other hyperlipidemia: Secondary | ICD-10-CM | POA: Diagnosis not present

## 2019-05-27 DIAGNOSIS — E11319 Type 2 diabetes mellitus with unspecified diabetic retinopathy without macular edema: Secondary | ICD-10-CM | POA: Diagnosis not present

## 2019-06-10 DIAGNOSIS — N401 Enlarged prostate with lower urinary tract symptoms: Secondary | ICD-10-CM | POA: Diagnosis not present

## 2019-06-10 DIAGNOSIS — M12861 Other specific arthropathies, not elsewhere classified, right knee: Secondary | ICD-10-CM | POA: Diagnosis not present

## 2019-06-10 DIAGNOSIS — N181 Chronic kidney disease, stage 1: Secondary | ICD-10-CM | POA: Diagnosis not present

## 2019-06-10 DIAGNOSIS — E1122 Type 2 diabetes mellitus with diabetic chronic kidney disease: Secondary | ICD-10-CM | POA: Diagnosis not present

## 2019-06-10 DIAGNOSIS — E7849 Other hyperlipidemia: Secondary | ICD-10-CM | POA: Diagnosis not present

## 2019-06-10 DIAGNOSIS — K210 Gastro-esophageal reflux disease with esophagitis: Secondary | ICD-10-CM | POA: Diagnosis not present

## 2019-06-10 DIAGNOSIS — I25118 Atherosclerotic heart disease of native coronary artery with other forms of angina pectoris: Secondary | ICD-10-CM | POA: Diagnosis not present

## 2019-06-10 DIAGNOSIS — I10 Essential (primary) hypertension: Secondary | ICD-10-CM | POA: Diagnosis not present

## 2019-06-18 DIAGNOSIS — E7849 Other hyperlipidemia: Secondary | ICD-10-CM | POA: Diagnosis not present

## 2019-06-18 DIAGNOSIS — N181 Chronic kidney disease, stage 1: Secondary | ICD-10-CM | POA: Diagnosis not present

## 2019-06-18 DIAGNOSIS — K210 Gastro-esophageal reflux disease with esophagitis: Secondary | ICD-10-CM | POA: Diagnosis not present

## 2019-06-18 DIAGNOSIS — I10 Essential (primary) hypertension: Secondary | ICD-10-CM | POA: Diagnosis not present

## 2019-06-18 DIAGNOSIS — E1122 Type 2 diabetes mellitus with diabetic chronic kidney disease: Secondary | ICD-10-CM | POA: Diagnosis not present

## 2019-07-02 DIAGNOSIS — C441121 Basal cell carcinoma of skin of right upper eyelid, including canthus: Secondary | ICD-10-CM | POA: Diagnosis not present

## 2019-07-28 DIAGNOSIS — E1122 Type 2 diabetes mellitus with diabetic chronic kidney disease: Secondary | ICD-10-CM | POA: Diagnosis not present

## 2019-07-28 DIAGNOSIS — N181 Chronic kidney disease, stage 1: Secondary | ICD-10-CM | POA: Diagnosis not present

## 2019-07-28 DIAGNOSIS — I10 Essential (primary) hypertension: Secondary | ICD-10-CM | POA: Diagnosis not present

## 2019-07-28 DIAGNOSIS — E7849 Other hyperlipidemia: Secondary | ICD-10-CM | POA: Diagnosis not present

## 2019-08-21 ENCOUNTER — Encounter: Payer: Self-pay | Admitting: *Deleted

## 2019-09-01 ENCOUNTER — Other Ambulatory Visit: Payer: Self-pay | Admitting: Cardiology

## 2019-09-05 ENCOUNTER — Other Ambulatory Visit: Payer: Self-pay

## 2019-09-12 DIAGNOSIS — I10 Essential (primary) hypertension: Secondary | ICD-10-CM | POA: Diagnosis not present

## 2019-09-12 DIAGNOSIS — E1122 Type 2 diabetes mellitus with diabetic chronic kidney disease: Secondary | ICD-10-CM | POA: Diagnosis not present

## 2019-09-12 DIAGNOSIS — E7849 Other hyperlipidemia: Secondary | ICD-10-CM | POA: Diagnosis not present

## 2019-09-12 DIAGNOSIS — N181 Chronic kidney disease, stage 1: Secondary | ICD-10-CM | POA: Diagnosis not present

## 2019-09-23 DIAGNOSIS — E7849 Other hyperlipidemia: Secondary | ICD-10-CM | POA: Diagnosis not present

## 2019-09-23 DIAGNOSIS — E1122 Type 2 diabetes mellitus with diabetic chronic kidney disease: Secondary | ICD-10-CM | POA: Diagnosis not present

## 2019-09-23 DIAGNOSIS — I10 Essential (primary) hypertension: Secondary | ICD-10-CM | POA: Diagnosis not present

## 2019-09-23 DIAGNOSIS — N181 Chronic kidney disease, stage 1: Secondary | ICD-10-CM | POA: Diagnosis not present

## 2019-09-26 ENCOUNTER — Other Ambulatory Visit: Payer: Self-pay

## 2019-09-26 ENCOUNTER — Ambulatory Visit (INDEPENDENT_AMBULATORY_CARE_PROVIDER_SITE_OTHER): Payer: Medicare Other | Admitting: Family Medicine

## 2019-09-26 ENCOUNTER — Encounter: Payer: Self-pay | Admitting: Cardiology

## 2019-09-26 VITALS — BP 140/68 | HR 56 | Ht 69.0 in | Wt 190.0 lb

## 2019-09-26 DIAGNOSIS — I779 Disorder of arteries and arterioles, unspecified: Secondary | ICD-10-CM | POA: Diagnosis not present

## 2019-09-26 DIAGNOSIS — I6523 Occlusion and stenosis of bilateral carotid arteries: Secondary | ICD-10-CM | POA: Diagnosis not present

## 2019-09-26 DIAGNOSIS — I25119 Atherosclerotic heart disease of native coronary artery with unspecified angina pectoris: Secondary | ICD-10-CM

## 2019-09-26 DIAGNOSIS — E782 Mixed hyperlipidemia: Secondary | ICD-10-CM

## 2019-09-26 DIAGNOSIS — I10 Essential (primary) hypertension: Secondary | ICD-10-CM | POA: Diagnosis not present

## 2019-09-26 NOTE — Patient Instructions (Signed)
Medication Instructions:  Continue all current medications.  Labwork: none  Testing/Procedures: none  Follow-Up: 1 year   Any Other Special Instructions Will Be Listed Below (If Applicable).  If you need a refill on your cardiac medications before your next appointment, please call your pharmacy.  

## 2019-09-26 NOTE — Progress Notes (Addendum)
Cardiology Office Note  Date: 09/26/2019   ID: Robert Banda Blue Sr., DOB [birth year], MRN [id number]  PCP:  Neale Burly, MD  Cardiologist:  Rozann Lesches, MD Electrophysiologist:  None   Chief Complaint  Patient presents with  . Follow-up    History of Present Illness: Robert E Rabenold Sr. is a 78 y.o. male last seen in the office with Dr. [name] in November 2019.  Subsequent tele-health visit in May 2020 with Dr. [name].  History of carotid artery disease, coronary artery disease with DES LAD/obtuse marginal October 2009, hypertension, hyperlipidemia, type 2 diabetes.  Patient states in the interim since last visit he had corona SARS 2 virus approximately 1 month ago and symptoms consisted of dry cough, low-grade temperature, and decreased energy.  Virus was communicated via his son. He feels much better now. He states during the virus he had issues with blood glucose control.  He denies any progressive anginal or exertional symptoms.  States he has not been very active during the pandemic and primarily sits at home in his recliner.  Had a carotid artery study last year in October which showed mild disease in both internal carotid arteries.  He has an upcoming repeat study on October 02, 2019.  Medications reviewed with patient and there have been no changes since last visit.  Past Medical History:  Diagnosis Date  . Carotid artery disease ([place])    50%, bilateral  by CTA 2011  . Coronary atherosclerosis of native coronary artery    DES to LAD/OM Aug 2009  . Essential hypertension, benign   . Mixed hyperlipidemia   . Myocardial infarction ([place]) 05/2008   Out of hospital late presentation inferoposterolateral myocardial infarction  . Sinus bradycardia   . Tick bite Aug. 2014   Leg  . Type 2 diabetes mellitus ([place])     History reviewed. No pertinent surgical history.  Current Outpatient Medications  Medication Sig Dispense Refill  . acarbose (PRECOSE) 100 MG  tablet Take 100 mg by mouth 3 (three) times daily with meals.    Marland Kitchen aspirin 81 MG tablet Take 81 mg by mouth daily. Reported on 02/04/2016    . carvedilol (COREG) 3.125 MG tablet Take 1 tablet by mouth twice daily 180 tablet 0  . dutasteride (AVODART) 0.5 MG capsule Take 0.5 mg by mouth daily.      Marland Kitchen glipiZIDE (GLUCOTROL) 10 MG tablet Take 10 mg by mouth daily before breakfast.    . hydrochlorothiazide (HYDRODIURIL) 25 MG tablet Take 1 tablet (25 mg total) by mouth daily. 90 tablet 3  . loratadine (CLARITIN) 10 MG tablet Take 10 mg by mouth daily as needed.     Marland Kitchen losartan (COZAAR) 100 MG tablet Take 1 tablet by mouth once daily 90 tablet 3  . metFORMIN (GLUCOPHAGE) 1000 MG tablet Take 1,000 mg by mouth daily with breakfast.    . nitroGLYCERIN (NITROSTAT) 0.4 MG SL tablet Place 1 tablet (0.4 mg total) under the tongue every 5 (five) minutes as needed for chest pain. If no relief after 3rd dose, proceed to ED 25 tablet 3  . omeprazole (PRILOSEC) 20 MG capsule Take 1 capsule by mouth daily.    . rosuvastatin (CRESTOR) 5 MG tablet Take 1 tablet by mouth daily.    Marland Kitchen zolpidem (AMBIEN) 5 MG tablet Take 5 mg by mouth daily as needed.       No current facility-administered medications for this visit.   Allergies:  Patient has no known allergies.  Social History: The patient  reports that he quit smoking about 35 years ago. His smoking use included cigarettes. He started smoking about 63 years ago. He has a 40.00 pack-year smoking history. He quit smokeless tobacco use about 28 years ago.  His smokeless tobacco use included chew. He reports current alcohol use. He reports that he does not use drugs.   Family History: The patient's family history includes Cancer in his father and mother; Coronary artery disease in an other family member.   ROS:  Please see the history of present illness. Otherwise, complete review of systems is positive for none.  All other systems are reviewed and negative.   Physical  Exam: VS:  BP 140/68   Pulse (!) 56   Ht 5\' 9"  (1.753 m)   Wt 190 lb (86.2 kg)   BMI 28.06 kg/m , BMI Body mass index is 28.06 kg/m.  Wt Readings from Last 3 Encounters:  09/26/19 190 lb (86.2 kg)  02/26/19 187 lb (84.8 kg)  08/26/18 198 lb (89.8 kg)    General: Patient appears comfortable at rest. Neck: Supple, no elevated JVP or carotid bruits, no thyromegaly. Lungs: Clear to auscultation, nonlabored breathing at rest. Cardiac: Regular rate and rhythm, no S3 or significant systolic murmur, no pericardial rub. Extremities: No pitting edema, distal pulses 2+. Skin: Warm and dry. Neuropsychiatric: Alert and oriented x3, affect grossly appropriate.  ECG:  An ECG dated September 26, 2019 was personally reviewed today and demonstrated:  Sinus bradycardia rate of 56, occasional PAC, old inferior lateral infarct, prominent R in V1, true posterior extension of inferior MI, prominent R wave in V1 and superior access, consider right ventricular hypertrophy, possible anterior fascicular block.  Recent Labwork: No results found for requested labs within last 8760 hours.  No results found for: CHOL, TRIG, HDL, CHOLHDL, VLDL, LDLCALC, LDLDIRECT  Most recent lab work in Feb 28, 2019 showed glucose 113, creatinine 1.56, GFR 42, sodium 139, potassium 4.5, total cholesterol 94, triglycerides 147, HDL 31, LDL 34, hemoglobin A1c is 6.2%  Other Studies Reviewed Today:   Carotid Dopplers 08/01/2018: Summary: Right Carotid: Velocities in the right ICA are consistent with a 1-39% stenosis.  Left Carotid: Velocities in the left ICA are consistent with a 1-39% stenosis.  Vertebrals: Bilateral vertebral arteries demonstrate antegrade flow. Subclavians: Normal flow hemodynamics were seen in bilateral subclavian       arteries.  Lexiscan Myoview 02/08/2015: IMPRESSION: 1. Inferolateral scar without large ischemic territories. 2. Mild basal lateral hypokinesis. 3. Left ventricular ejection  fraction 51% 4. Overall low-risk stress test findings  Assessment and Plan:  1. Coronary artery disease involving native coronary artery of native heart with angina pectoris ([place])   2. Carotid artery disease, unspecified laterality, unspecified type ([place])   3. Essential hypertension   4. Mixed hyperlipidemia    1. Coronary Artery disease Previous drug-eluting stent to LAD/OM 11/2007.  No recent progressive anginal or exertional symptoms. Continue aspirin 81 mg, nitroglycerin sublingual as needed, statin, carvedilol.  2. Essential hypertension Blood pressure acceptable today at 140/68.  States his blood pressure is much better at home.  Continue losartan 100 mg daily and hydrochlorothiazide 25 mg daily  3. Mixed hyperlipidemia On Crestor 5 mg daily.  Recent LDL in May was 34.  Continue Crestor.  4. Bilateral carotid artery stenosis Patient had mild carotid artery disease with 1 to 39% stenosis in both left and right ICAs.  Both vertebral arteries demonstrated antegrade flow.  Normal flow seen in subclavian  arteries.  On aspirin 81 mg and Crestor 5 mg daily.  Patient has upcoming repeat carotid study October 02, 2019  Medication Adjustments/Labs and Tests Ordered: Current medicines are reviewed at length with the patient today.  Concerns regarding medicines are outlined above.    There are no Patient Instructions on file for this visit.       Signed, Levell July, NP 09/26/2019 8:21 AM    [facility] [place], [place], [place] [address] Phone: [phone]; Fax: [phone]

## 2019-09-29 DIAGNOSIS — I10 Essential (primary) hypertension: Secondary | ICD-10-CM | POA: Diagnosis not present

## 2019-09-29 DIAGNOSIS — E7849 Other hyperlipidemia: Secondary | ICD-10-CM | POA: Diagnosis not present

## 2019-09-29 DIAGNOSIS — E1122 Type 2 diabetes mellitus with diabetic chronic kidney disease: Secondary | ICD-10-CM | POA: Diagnosis not present

## 2019-10-02 ENCOUNTER — Other Ambulatory Visit: Payer: Self-pay

## 2019-10-02 ENCOUNTER — Ambulatory Visit (INDEPENDENT_AMBULATORY_CARE_PROVIDER_SITE_OTHER): Payer: Medicare Other

## 2019-10-02 DIAGNOSIS — I779 Disorder of arteries and arterioles, unspecified: Secondary | ICD-10-CM

## 2019-10-03 ENCOUNTER — Telehealth: Payer: Self-pay | Admitting: *Deleted

## 2019-10-03 ENCOUNTER — Telehealth: Payer: Self-pay | Admitting: Cardiology

## 2019-10-03 DIAGNOSIS — I779 Disorder of arteries and arterioles, unspecified: Secondary | ICD-10-CM

## 2019-10-03 NOTE — Telephone Encounter (Signed)
Patient informed. Copy sent to PCP °

## 2019-10-03 NOTE — Telephone Encounter (Signed)
-----   Message from Satira Sark, MD sent at 10/03/2019  8:12 AM EST ----- Results reviewed.  This is a preliminary report that indicates stable, mild ICA atherosclerosis at 1 to 39% (please keep an eye out for the finalized report, but would not anticipate any major changes at this point).  Continue medical therapy and routine follow-up for now.

## 2019-10-03 NOTE — Telephone Encounter (Signed)
  Precert needed for: Carotid   Location:   [facility]  Date: Oct 06, 2020

## 2019-10-29 DIAGNOSIS — E1122 Type 2 diabetes mellitus with diabetic chronic kidney disease: Secondary | ICD-10-CM | POA: Diagnosis not present

## 2019-10-29 DIAGNOSIS — K219 Gastro-esophageal reflux disease without esophagitis: Secondary | ICD-10-CM | POA: Diagnosis not present

## 2019-10-29 DIAGNOSIS — I10 Essential (primary) hypertension: Secondary | ICD-10-CM | POA: Diagnosis not present

## 2019-10-29 DIAGNOSIS — E7849 Other hyperlipidemia: Secondary | ICD-10-CM | POA: Diagnosis not present

## 2019-10-31 DIAGNOSIS — Z23 Encounter for immunization: Secondary | ICD-10-CM | POA: Diagnosis not present

## 2019-11-28 DIAGNOSIS — Z23 Encounter for immunization: Secondary | ICD-10-CM | POA: Diagnosis not present

## 2019-12-03 DIAGNOSIS — E7849 Other hyperlipidemia: Secondary | ICD-10-CM | POA: Diagnosis not present

## 2019-12-03 DIAGNOSIS — I10 Essential (primary) hypertension: Secondary | ICD-10-CM | POA: Diagnosis not present

## 2019-12-03 DIAGNOSIS — K219 Gastro-esophageal reflux disease without esophagitis: Secondary | ICD-10-CM | POA: Diagnosis not present

## 2019-12-03 DIAGNOSIS — E1122 Type 2 diabetes mellitus with diabetic chronic kidney disease: Secondary | ICD-10-CM | POA: Diagnosis not present

## 2019-12-04 ENCOUNTER — Other Ambulatory Visit: Payer: Self-pay | Admitting: Cardiology

## 2019-12-23 ENCOUNTER — Other Ambulatory Visit: Payer: Self-pay | Admitting: Cardiology

## 2019-12-29 DIAGNOSIS — E7849 Other hyperlipidemia: Secondary | ICD-10-CM | POA: Diagnosis not present

## 2019-12-29 DIAGNOSIS — I10 Essential (primary) hypertension: Secondary | ICD-10-CM | POA: Diagnosis not present

## 2019-12-29 DIAGNOSIS — K219 Gastro-esophageal reflux disease without esophagitis: Secondary | ICD-10-CM | POA: Diagnosis not present

## 2019-12-29 DIAGNOSIS — E1122 Type 2 diabetes mellitus with diabetic chronic kidney disease: Secondary | ICD-10-CM | POA: Diagnosis not present

## 2020-01-02 DIAGNOSIS — C44329 Squamous cell carcinoma of skin of other parts of face: Secondary | ICD-10-CM | POA: Diagnosis not present

## 2020-01-02 DIAGNOSIS — D485 Neoplasm of uncertain behavior of skin: Secondary | ICD-10-CM | POA: Diagnosis not present

## 2020-01-02 DIAGNOSIS — L570 Actinic keratosis: Secondary | ICD-10-CM | POA: Diagnosis not present

## 2020-01-02 DIAGNOSIS — C44629 Squamous cell carcinoma of skin of left upper limb, including shoulder: Secondary | ICD-10-CM | POA: Diagnosis not present

## 2020-01-07 DIAGNOSIS — I10 Essential (primary) hypertension: Secondary | ICD-10-CM | POA: Diagnosis not present

## 2020-01-07 DIAGNOSIS — K219 Gastro-esophageal reflux disease without esophagitis: Secondary | ICD-10-CM | POA: Diagnosis not present

## 2020-01-07 DIAGNOSIS — M12861 Other specific arthropathies, not elsewhere classified, right knee: Secondary | ICD-10-CM | POA: Diagnosis not present

## 2020-01-07 DIAGNOSIS — N401 Enlarged prostate with lower urinary tract symptoms: Secondary | ICD-10-CM | POA: Diagnosis not present

## 2020-01-07 DIAGNOSIS — I25118 Atherosclerotic heart disease of native coronary artery with other forms of angina pectoris: Secondary | ICD-10-CM | POA: Diagnosis not present

## 2020-01-07 DIAGNOSIS — N181 Chronic kidney disease, stage 1: Secondary | ICD-10-CM | POA: Diagnosis not present

## 2020-01-07 DIAGNOSIS — Z1331 Encounter for screening for depression: Secondary | ICD-10-CM | POA: Diagnosis not present

## 2020-01-07 DIAGNOSIS — E1122 Type 2 diabetes mellitus with diabetic chronic kidney disease: Secondary | ICD-10-CM | POA: Diagnosis not present

## 2020-01-07 DIAGNOSIS — E7849 Other hyperlipidemia: Secondary | ICD-10-CM | POA: Diagnosis not present

## 2020-01-07 DIAGNOSIS — Z0000 Encounter for general adult medical examination without abnormal findings: Secondary | ICD-10-CM | POA: Diagnosis not present

## 2020-01-22 DIAGNOSIS — K219 Gastro-esophageal reflux disease without esophagitis: Secondary | ICD-10-CM | POA: Diagnosis not present

## 2020-01-22 DIAGNOSIS — I10 Essential (primary) hypertension: Secondary | ICD-10-CM | POA: Diagnosis not present

## 2020-01-22 DIAGNOSIS — E1122 Type 2 diabetes mellitus with diabetic chronic kidney disease: Secondary | ICD-10-CM | POA: Diagnosis not present

## 2020-01-22 DIAGNOSIS — E7849 Other hyperlipidemia: Secondary | ICD-10-CM | POA: Diagnosis not present

## 2020-02-10 DIAGNOSIS — D0462 Carcinoma in situ of skin of left upper limb, including shoulder: Secondary | ICD-10-CM | POA: Diagnosis not present

## 2020-02-25 DIAGNOSIS — S65312A Laceration of deep palmar arch of left hand, initial encounter: Secondary | ICD-10-CM | POA: Diagnosis not present

## 2020-03-08 DIAGNOSIS — I10 Essential (primary) hypertension: Secondary | ICD-10-CM | POA: Diagnosis not present

## 2020-03-08 DIAGNOSIS — E119 Type 2 diabetes mellitus without complications: Secondary | ICD-10-CM | POA: Diagnosis not present

## 2020-04-19 DIAGNOSIS — F5101 Primary insomnia: Secondary | ICD-10-CM | POA: Diagnosis not present

## 2020-04-19 DIAGNOSIS — E7849 Other hyperlipidemia: Secondary | ICD-10-CM | POA: Diagnosis not present

## 2020-04-19 DIAGNOSIS — E1165 Type 2 diabetes mellitus with hyperglycemia: Secondary | ICD-10-CM | POA: Diagnosis not present

## 2020-04-19 DIAGNOSIS — N401 Enlarged prostate with lower urinary tract symptoms: Secondary | ICD-10-CM | POA: Diagnosis not present

## 2020-04-19 DIAGNOSIS — N181 Chronic kidney disease, stage 1: Secondary | ICD-10-CM | POA: Diagnosis not present

## 2020-04-19 DIAGNOSIS — S65312A Laceration of deep palmar arch of left hand, initial encounter: Secondary | ICD-10-CM | POA: Diagnosis not present

## 2020-04-19 DIAGNOSIS — K219 Gastro-esophageal reflux disease without esophagitis: Secondary | ICD-10-CM | POA: Diagnosis not present

## 2020-05-03 DIAGNOSIS — E7849 Other hyperlipidemia: Secondary | ICD-10-CM | POA: Diagnosis not present

## 2020-05-03 DIAGNOSIS — N401 Enlarged prostate with lower urinary tract symptoms: Secondary | ICD-10-CM | POA: Diagnosis not present

## 2020-05-03 DIAGNOSIS — K219 Gastro-esophageal reflux disease without esophagitis: Secondary | ICD-10-CM | POA: Diagnosis not present

## 2020-05-03 DIAGNOSIS — N181 Chronic kidney disease, stage 1: Secondary | ICD-10-CM | POA: Diagnosis not present

## 2020-05-03 DIAGNOSIS — I10 Essential (primary) hypertension: Secondary | ICD-10-CM | POA: Diagnosis not present

## 2020-05-25 DIAGNOSIS — I10 Essential (primary) hypertension: Secondary | ICD-10-CM | POA: Diagnosis not present

## 2020-05-25 DIAGNOSIS — N401 Enlarged prostate with lower urinary tract symptoms: Secondary | ICD-10-CM | POA: Diagnosis not present

## 2020-05-25 DIAGNOSIS — E7849 Other hyperlipidemia: Secondary | ICD-10-CM | POA: Diagnosis not present

## 2020-05-25 DIAGNOSIS — K219 Gastro-esophageal reflux disease without esophagitis: Secondary | ICD-10-CM | POA: Diagnosis not present

## 2020-05-25 DIAGNOSIS — N181 Chronic kidney disease, stage 1: Secondary | ICD-10-CM | POA: Diagnosis not present

## 2020-06-28 DIAGNOSIS — N401 Enlarged prostate with lower urinary tract symptoms: Secondary | ICD-10-CM | POA: Diagnosis not present

## 2020-06-28 DIAGNOSIS — K219 Gastro-esophageal reflux disease without esophagitis: Secondary | ICD-10-CM | POA: Diagnosis not present

## 2020-06-28 DIAGNOSIS — E7849 Other hyperlipidemia: Secondary | ICD-10-CM | POA: Diagnosis not present

## 2020-06-28 DIAGNOSIS — N181 Chronic kidney disease, stage 1: Secondary | ICD-10-CM | POA: Diagnosis not present

## 2020-06-28 DIAGNOSIS — I10 Essential (primary) hypertension: Secondary | ICD-10-CM | POA: Diagnosis not present

## 2020-07-06 DIAGNOSIS — L821 Other seborrheic keratosis: Secondary | ICD-10-CM | POA: Diagnosis not present

## 2020-07-06 DIAGNOSIS — L570 Actinic keratosis: Secondary | ICD-10-CM | POA: Diagnosis not present

## 2020-07-06 DIAGNOSIS — L905 Scar conditions and fibrosis of skin: Secondary | ICD-10-CM | POA: Diagnosis not present

## 2020-07-06 DIAGNOSIS — Z85828 Personal history of other malignant neoplasm of skin: Secondary | ICD-10-CM | POA: Diagnosis not present

## 2020-07-21 DIAGNOSIS — N181 Chronic kidney disease, stage 1: Secondary | ICD-10-CM | POA: Diagnosis not present

## 2020-07-21 DIAGNOSIS — E1121 Type 2 diabetes mellitus with diabetic nephropathy: Secondary | ICD-10-CM | POA: Diagnosis not present

## 2020-07-21 DIAGNOSIS — I10 Essential (primary) hypertension: Secondary | ICD-10-CM | POA: Diagnosis not present

## 2020-07-21 DIAGNOSIS — K219 Gastro-esophageal reflux disease without esophagitis: Secondary | ICD-10-CM | POA: Diagnosis not present

## 2020-07-21 DIAGNOSIS — F5101 Primary insomnia: Secondary | ICD-10-CM | POA: Diagnosis not present

## 2020-07-21 DIAGNOSIS — E7849 Other hyperlipidemia: Secondary | ICD-10-CM | POA: Diagnosis not present

## 2020-07-21 DIAGNOSIS — N401 Enlarged prostate with lower urinary tract symptoms: Secondary | ICD-10-CM | POA: Diagnosis not present

## 2020-07-23 DIAGNOSIS — E11319 Type 2 diabetes mellitus with unspecified diabetic retinopathy without macular edema: Secondary | ICD-10-CM | POA: Diagnosis not present

## 2020-07-26 DIAGNOSIS — E1121 Type 2 diabetes mellitus with diabetic nephropathy: Secondary | ICD-10-CM | POA: Diagnosis not present

## 2020-07-26 DIAGNOSIS — I10 Essential (primary) hypertension: Secondary | ICD-10-CM | POA: Diagnosis not present

## 2020-07-26 DIAGNOSIS — E7849 Other hyperlipidemia: Secondary | ICD-10-CM | POA: Diagnosis not present

## 2020-07-26 DIAGNOSIS — K219 Gastro-esophageal reflux disease without esophagitis: Secondary | ICD-10-CM | POA: Diagnosis not present

## 2020-08-21 DIAGNOSIS — Z23 Encounter for immunization: Secondary | ICD-10-CM | POA: Diagnosis not present

## 2020-08-30 ENCOUNTER — Other Ambulatory Visit: Payer: Self-pay | Admitting: Cardiology

## 2020-08-31 DIAGNOSIS — E7849 Other hyperlipidemia: Secondary | ICD-10-CM | POA: Diagnosis not present

## 2020-08-31 DIAGNOSIS — K219 Gastro-esophageal reflux disease without esophagitis: Secondary | ICD-10-CM | POA: Diagnosis not present

## 2020-08-31 DIAGNOSIS — I10 Essential (primary) hypertension: Secondary | ICD-10-CM | POA: Diagnosis not present

## 2020-08-31 DIAGNOSIS — E1121 Type 2 diabetes mellitus with diabetic nephropathy: Secondary | ICD-10-CM | POA: Diagnosis not present

## 2020-09-18 ENCOUNTER — Other Ambulatory Visit: Payer: Self-pay | Admitting: Cardiology

## 2020-09-30 DIAGNOSIS — E7849 Other hyperlipidemia: Secondary | ICD-10-CM | POA: Diagnosis not present

## 2020-09-30 DIAGNOSIS — K219 Gastro-esophageal reflux disease without esophagitis: Secondary | ICD-10-CM | POA: Diagnosis not present

## 2020-09-30 DIAGNOSIS — I10 Essential (primary) hypertension: Secondary | ICD-10-CM | POA: Diagnosis not present

## 2020-09-30 DIAGNOSIS — E1121 Type 2 diabetes mellitus with diabetic nephropathy: Secondary | ICD-10-CM | POA: Diagnosis not present

## 2020-10-06 ENCOUNTER — Ambulatory Visit (INDEPENDENT_AMBULATORY_CARE_PROVIDER_SITE_OTHER): Payer: Medicare Other

## 2020-10-06 DIAGNOSIS — I779 Disorder of arteries and arterioles, unspecified: Secondary | ICD-10-CM

## 2020-10-06 DIAGNOSIS — I6523 Occlusion and stenosis of bilateral carotid arteries: Secondary | ICD-10-CM | POA: Diagnosis not present

## 2020-10-07 ENCOUNTER — Telehealth: Payer: Self-pay | Admitting: *Deleted

## 2020-10-07 NOTE — Telephone Encounter (Signed)
Patient informed. Copy sent to PCP °

## 2020-10-07 NOTE — Telephone Encounter (Signed)
-----   Message from Satira Sark, MD sent at 10/06/2020 11:07 AM EST ----- Results reviewed.  Stable, mild ICA atherosclerosis bilaterally.  Continue medical therapy and observation.

## 2020-10-21 ENCOUNTER — Encounter: Payer: Self-pay | Admitting: Cardiology

## 2020-10-21 DIAGNOSIS — N401 Enlarged prostate with lower urinary tract symptoms: Secondary | ICD-10-CM | POA: Diagnosis not present

## 2020-10-21 DIAGNOSIS — I10 Essential (primary) hypertension: Secondary | ICD-10-CM | POA: Diagnosis not present

## 2020-10-21 DIAGNOSIS — N181 Chronic kidney disease, stage 1: Secondary | ICD-10-CM | POA: Diagnosis not present

## 2020-10-21 DIAGNOSIS — K219 Gastro-esophageal reflux disease without esophagitis: Secondary | ICD-10-CM | POA: Diagnosis not present

## 2020-10-21 DIAGNOSIS — E7849 Other hyperlipidemia: Secondary | ICD-10-CM | POA: Diagnosis not present

## 2020-10-21 DIAGNOSIS — F5101 Primary insomnia: Secondary | ICD-10-CM | POA: Diagnosis not present

## 2020-10-21 DIAGNOSIS — E1121 Type 2 diabetes mellitus with diabetic nephropathy: Secondary | ICD-10-CM | POA: Diagnosis not present

## 2020-11-08 ENCOUNTER — Ambulatory Visit (INDEPENDENT_AMBULATORY_CARE_PROVIDER_SITE_OTHER): Payer: Medicare Other | Admitting: Cardiology

## 2020-11-08 ENCOUNTER — Encounter: Payer: Self-pay | Admitting: Cardiology

## 2020-11-08 ENCOUNTER — Other Ambulatory Visit: Payer: Self-pay

## 2020-11-08 ENCOUNTER — Encounter: Payer: Self-pay | Admitting: *Deleted

## 2020-11-08 VITALS — BP 130/64 | HR 54 | Ht 70.0 in | Wt 189.0 lb

## 2020-11-08 DIAGNOSIS — I6523 Occlusion and stenosis of bilateral carotid arteries: Secondary | ICD-10-CM | POA: Diagnosis not present

## 2020-11-08 DIAGNOSIS — E782 Mixed hyperlipidemia: Secondary | ICD-10-CM

## 2020-11-08 DIAGNOSIS — I25119 Atherosclerotic heart disease of native coronary artery with unspecified angina pectoris: Secondary | ICD-10-CM | POA: Diagnosis not present

## 2020-11-08 NOTE — Patient Instructions (Addendum)

## 2020-11-08 NOTE — Progress Notes (Signed)
Cardiology Office Note  Date: 11/08/2020   ID: Robert Banda Jefcoat Sr., DOB 09-02-1941, MRN [id number]  PCP:  Neale Burly, MD  Cardiologist:  Rozann Lesches, MD Electrophysiologist:  None   Chief Complaint  Patient presents with  . Cardiac follow-up    History of Present Illness: Robert E Lingle Sr. is an 80 y.o. male last seen in December 2020 by Mr. Leonides Sake NP.  He presents for a routine visit, doing well without active angina symptoms.  He remains functional with ADLs and states that he has been compliant with his medications.  I personally reviewed his ECG today which shows sinus bradycardia, old inferolateral infarct pattern.  We are requesting interval lab work from his PCP.  I reviewed his medications which are stable and outlined below.  He has not required any nitroglycerin use.  Past Medical History:  Diagnosis Date  . Carotid artery disease ([place])    50%, bilateral  by CTA 2011  . Coronary atherosclerosis of native coronary artery    DES to LAD/OM Aug 2009  . Essential hypertension, benign   . Mixed hyperlipidemia   . Myocardial infarction ([place]) 05/2008   Out of hospital late presentation inferoposterolateral myocardial infarction  . Sinus bradycardia   . Tick bite Aug. 2014   Leg  . Type 2 diabetes mellitus ([place])     History reviewed. No pertinent surgical history.  Current Outpatient Medications  Medication Sig Dispense Refill  . acarbose (PRECOSE) 100 MG tablet Take 100 mg by mouth 3 (three) times daily with meals.    Marland Kitchen aspirin 81 MG tablet Take 81 mg by mouth daily. Reported on 02/04/2016    . carvedilol (COREG) 3.125 MG tablet Take 1 tablet by mouth twice daily 180 tablet 0  . dutasteride (AVODART) 0.5 MG capsule Take 0.5 mg by mouth daily.    Marland Kitchen glipiZIDE (GLUCOTROL) 10 MG tablet Take 10 mg by mouth daily before breakfast.    . hydrochlorothiazide (HYDRODIURIL) 25 MG tablet Take 1 tablet (25 mg total) by mouth daily. 90 tablet 3  . loratadine  (CLARITIN) 10 MG tablet Take 10 mg by mouth daily as needed.    Marland Kitchen losartan (COZAAR) 100 MG tablet Take 1 tablet by mouth once daily 90 tablet 1  . metFORMIN (GLUCOPHAGE) 1000 MG tablet Take 1,000 mg by mouth daily with breakfast.    . nitroGLYCERIN (NITROSTAT) 0.4 MG SL tablet Place 1 tablet (0.4 mg total) under the tongue every 5 (five) minutes as needed for chest pain. If no relief after 3rd dose, proceed to ED 25 tablet 3  . omeprazole (PRILOSEC) 20 MG capsule Take 1 capsule by mouth daily.    . rosuvastatin (CRESTOR) 5 MG tablet Take 1 tablet by mouth daily.    Marland Kitchen zolpidem (AMBIEN) 5 MG tablet Take 5 mg by mouth daily as needed.     No current facility-administered medications for this visit.   Allergies:  Patient has no known allergies.   ROS: No palpitations or syncope.  Physical Exam: VS:  BP 130/64   Pulse (!) 54   Ht 5\' 10"  (1.778 m)   Wt 189 lb (85.7 kg)   SpO2 97%   BMI 27.12 kg/m , BMI Body mass index is 27.12 kg/m.  Wt Readings from Last 3 Encounters:  11/08/20 189 lb (85.7 kg)  09/26/19 190 lb (86.2 kg)  02/26/19 187 lb (84.8 kg)    General: Elderly male, appears comfortable at rest. HEENT: Conjunctiva and lids normal,  wearing a mask. Neck: Supple, no elevated JVP or carotid bruits, no thyromegaly. Lungs: Clear to auscultation, nonlabored breathing at rest. Cardiac: Regular rate and rhythm, no S3 or significant systolic murmur, no pericardial rub. Extremities: No pitting edema.  ECG:  An ECG dated 09/26/2019 was personally reviewed today and demonstrated:  Sinus bradycardia with PAC, old inferolateral infarct pattern.  Recent Labwork:  May 2020: BUN 31, creatinine 1.56, potassium 4.5, cholesterol 94, triglycerides 147, HDL 31, LDL 34, hemoglobin A1c 6.2%  Other Studies Reviewed Today:  Lexiscan Myoview 02/08/2015: IMPRESSION: 1. Inferolateral scar without large ischemic territories.  2. Mild basal lateral hypokinesis.  3. Left ventricular ejection  fraction 51%  4. Overall low-risk stress test findings*.  Carotid Dopplers 10/06/2020: Summary:  Right Carotid: Velocities in the right ICA are consistent with a 1-39%  stenosis.   Left Carotid: Velocities in the left ICA are consistent with a 1-39%  stenosis.   Vertebrals: Bilateral vertebral arteries demonstrate antegrade flow.  Subclavians: Normal flow hemodynamics were seen in bilateral subclavian        arteries.   Assessment and Plan:  1.  CAD status post DES to the LAD and obtuse marginal in 2009.  He reports no active angina symptoms on medical therapy with good stamina, ECG reviewed and stable.  No clear indication for follow-up ischemic testing at this time.  Continue aspirin, Coreg, losartan, and Crestor.  He has as needed nitroglycerin available.  2.  Asymptomatic, mild carotid artery disease.  Continue aspirin and Crestor.  Recent carotid Dopplers are noted above.  3.  Mixed hyperlipidemia on Crestor.  Requesting interval lab work from PCP.  Medication Adjustments/Labs and Tests Ordered: Current medicines are reviewed at length with the patient today.  Concerns regarding medicines are outlined above.   Tests Ordered: Orders Placed This Encounter  Procedures  . EKG 12-Lead    Medication Changes: No orders of the defined types were placed in this encounter.   Disposition:  Follow up 1 year in the [place] office.  Signed, Satira Sark, MD, [facility] 11/08/2020 12:00 PM    [facility] [place], [place], [place] [address] Phone: [phone]; Fax: [phone]

## 2020-12-01 ENCOUNTER — Other Ambulatory Visit: Payer: Self-pay | Admitting: Cardiology

## 2020-12-20 ENCOUNTER — Other Ambulatory Visit: Payer: Self-pay | Admitting: Cardiology

## 2021-01-24 DIAGNOSIS — Z0000 Encounter for general adult medical examination without abnormal findings: Secondary | ICD-10-CM | POA: Diagnosis not present

## 2021-01-24 DIAGNOSIS — K219 Gastro-esophageal reflux disease without esophagitis: Secondary | ICD-10-CM | POA: Diagnosis not present

## 2021-01-24 DIAGNOSIS — F5101 Primary insomnia: Secondary | ICD-10-CM | POA: Diagnosis not present

## 2021-01-24 DIAGNOSIS — Z1331 Encounter for screening for depression: Secondary | ICD-10-CM | POA: Diagnosis not present

## 2021-01-24 DIAGNOSIS — E1121 Type 2 diabetes mellitus with diabetic nephropathy: Secondary | ICD-10-CM | POA: Diagnosis not present

## 2021-01-24 DIAGNOSIS — N401 Enlarged prostate with lower urinary tract symptoms: Secondary | ICD-10-CM | POA: Diagnosis not present

## 2021-01-24 DIAGNOSIS — E7849 Other hyperlipidemia: Secondary | ICD-10-CM | POA: Diagnosis not present

## 2021-01-24 DIAGNOSIS — N181 Chronic kidney disease, stage 1: Secondary | ICD-10-CM | POA: Diagnosis not present

## 2021-01-24 DIAGNOSIS — I10 Essential (primary) hypertension: Secondary | ICD-10-CM | POA: Diagnosis not present

## 2021-01-27 DIAGNOSIS — D4989 Neoplasm of unspecified behavior of other specified sites: Secondary | ICD-10-CM | POA: Diagnosis not present

## 2021-01-27 DIAGNOSIS — H119 Unspecified disorder of conjunctiva: Secondary | ICD-10-CM | POA: Diagnosis not present

## 2021-02-15 DIAGNOSIS — D4989 Neoplasm of unspecified behavior of other specified sites: Secondary | ICD-10-CM | POA: Diagnosis not present

## 2021-02-15 DIAGNOSIS — E119 Type 2 diabetes mellitus without complications: Secondary | ICD-10-CM | POA: Diagnosis not present

## 2021-02-15 DIAGNOSIS — Z01818 Encounter for other preprocedural examination: Secondary | ICD-10-CM | POA: Diagnosis not present

## 2021-02-23 DIAGNOSIS — D4989 Neoplasm of unspecified behavior of other specified sites: Secondary | ICD-10-CM | POA: Diagnosis not present

## 2021-02-23 DIAGNOSIS — H1189 Other specified disorders of conjunctiva: Secondary | ICD-10-CM | POA: Diagnosis not present

## 2021-04-27 DIAGNOSIS — K219 Gastro-esophageal reflux disease without esophagitis: Secondary | ICD-10-CM | POA: Diagnosis not present

## 2021-04-27 DIAGNOSIS — L570 Actinic keratosis: Secondary | ICD-10-CM | POA: Diagnosis not present

## 2021-04-27 DIAGNOSIS — I10 Essential (primary) hypertension: Secondary | ICD-10-CM | POA: Diagnosis not present

## 2021-04-27 DIAGNOSIS — N181 Chronic kidney disease, stage 1: Secondary | ICD-10-CM | POA: Diagnosis not present

## 2021-04-27 DIAGNOSIS — E1121 Type 2 diabetes mellitus with diabetic nephropathy: Secondary | ICD-10-CM | POA: Diagnosis not present

## 2021-04-27 DIAGNOSIS — Z0000 Encounter for general adult medical examination without abnormal findings: Secondary | ICD-10-CM | POA: Diagnosis not present

## 2021-04-27 DIAGNOSIS — F5101 Primary insomnia: Secondary | ICD-10-CM | POA: Diagnosis not present

## 2021-04-27 DIAGNOSIS — N401 Enlarged prostate with lower urinary tract symptoms: Secondary | ICD-10-CM | POA: Diagnosis not present

## 2021-04-27 DIAGNOSIS — N189 Chronic kidney disease, unspecified: Secondary | ICD-10-CM | POA: Diagnosis not present

## 2021-04-27 DIAGNOSIS — E7849 Other hyperlipidemia: Secondary | ICD-10-CM | POA: Diagnosis not present

## 2021-05-21 DIAGNOSIS — U071 COVID-19: Secondary | ICD-10-CM | POA: Diagnosis not present

## 2021-06-06 ENCOUNTER — Other Ambulatory Visit: Payer: Self-pay | Admitting: Cardiology

## 2021-08-01 DIAGNOSIS — N401 Enlarged prostate with lower urinary tract symptoms: Secondary | ICD-10-CM | POA: Diagnosis not present

## 2021-08-01 DIAGNOSIS — E1121 Type 2 diabetes mellitus with diabetic nephropathy: Secondary | ICD-10-CM | POA: Diagnosis not present

## 2021-08-01 DIAGNOSIS — N181 Chronic kidney disease, stage 1: Secondary | ICD-10-CM | POA: Diagnosis not present

## 2021-08-01 DIAGNOSIS — E7849 Other hyperlipidemia: Secondary | ICD-10-CM | POA: Diagnosis not present

## 2021-08-01 DIAGNOSIS — I10 Essential (primary) hypertension: Secondary | ICD-10-CM | POA: Diagnosis not present

## 2021-08-01 DIAGNOSIS — F5101 Primary insomnia: Secondary | ICD-10-CM | POA: Diagnosis not present

## 2021-08-01 DIAGNOSIS — K219 Gastro-esophageal reflux disease without esophagitis: Secondary | ICD-10-CM | POA: Diagnosis not present

## 2021-11-08 DIAGNOSIS — N401 Enlarged prostate with lower urinary tract symptoms: Secondary | ICD-10-CM | POA: Diagnosis not present

## 2021-11-08 DIAGNOSIS — F5101 Primary insomnia: Secondary | ICD-10-CM | POA: Diagnosis not present

## 2021-11-08 DIAGNOSIS — N181 Chronic kidney disease, stage 1: Secondary | ICD-10-CM | POA: Diagnosis not present

## 2021-11-08 DIAGNOSIS — E1121 Type 2 diabetes mellitus with diabetic nephropathy: Secondary | ICD-10-CM | POA: Diagnosis not present

## 2021-11-08 DIAGNOSIS — K219 Gastro-esophageal reflux disease without esophagitis: Secondary | ICD-10-CM | POA: Diagnosis not present

## 2021-11-08 DIAGNOSIS — I10 Essential (primary) hypertension: Secondary | ICD-10-CM | POA: Diagnosis not present

## 2021-11-08 DIAGNOSIS — E7849 Other hyperlipidemia: Secondary | ICD-10-CM | POA: Diagnosis not present

## 2022-02-13 ENCOUNTER — Encounter: Payer: Self-pay | Admitting: Cardiology

## 2022-02-13 NOTE — Progress Notes (Signed)
? ? ?Cardiology Office Note ? ?Date: 02/14/2022  ? ?ID: Robert Banda Filsinger Sr., DOB 1941/02/26, MRN [id number] ? ?PCP:  Neale Burly, MD  ?Cardiologist:  Rozann Lesches, MD ?Electrophysiologist:  None  ? ?Chief Complaint  ?Patient presents with  ? Cardiac follow-up  ? ? ?History of Present Illness: ?Robert E Paolucci Sr. is an 81 y.o. male last seen in January 2022.  He is here overdue for follow-up.  From a cardiac perspective, he does not describe any angina symptoms, still takes care of his own house and yard work.  He has not used any nitroglycerin in the interim.  Reports NYHA class II dyspnea with most activities, lack of stamina.  No sudden dizziness or syncope, although he does feel somewhat unsteady when he first gets up in the morning.  No falls. ? ?I personally reviewed his ECG today which shows sinus bradycardia with PACs, old inferior infarct pattern.  We went over his medications, he is on Coreg at 3.125 mg twice daily. ? ?Continues to follow with Dr. Sherrie Sport for primary care. ? ?Past Medical History:  ?Diagnosis Date  ? Carotid artery disease ([place])   ? Coronary atherosclerosis of native coronary artery   ? DES to LAD/OM Aug 2009  ? Essential hypertension   ? Mixed hyperlipidemia   ? Myocardial infarction ([place]) 05/16/2008  ? Out of hospital late presentation inferoposterolateral myocardial infarction  ? Sinus bradycardia   ? Tick bite 05/16/2013  ? Leg  ? Type 2 diabetes mellitus ([place])   ? ? ?History reviewed. No pertinent surgical history. ? ?Current Outpatient Medications  ?Medication Sig Dispense Refill  ? aspirin 81 MG tablet Take 81 mg by mouth daily. Reported on 02/04/2016    ? dutasteride (AVODART) 0.5 MG capsule Take 0.5 mg by mouth daily.    ? glipiZIDE (GLUCOTROL) 10 MG tablet Take 10 mg by mouth 2 (two) times daily before a meal.    ? hydrochlorothiazide (HYDRODIURIL) 25 MG tablet Take 1 tablet (25 mg total) by mouth daily. 90 tablet 3  ? loratadine (CLARITIN) 10 MG tablet Take 10 mg by mouth  daily as needed.    ? losartan (COZAAR) 100 MG tablet Take 1 tablet by mouth once daily 90 tablet 3  ? metFORMIN (GLUCOPHAGE) 1000 MG tablet Take 1,000 mg by mouth daily with breakfast.    ? nitroGLYCERIN (NITROSTAT) 0.4 MG SL tablet Place 1 tablet (0.4 mg total) under the tongue every 5 (five) minutes as needed for chest pain. If no relief after 3rd dose, proceed to ED 25 tablet 3  ? rosuvastatin (CRESTOR) 5 MG tablet Take 1 tablet by mouth daily.    ? zolpidem (AMBIEN) 5 MG tablet Take 5 mg by mouth daily as needed.    ? ?No current facility-administered medications for this visit.  ? ?Allergies:  Patient has no known allergies.  ? ?ROS: No orthopnea or PND. ? ?Physical Exam: ?VS:  BP 140/70   Pulse (!) 50   Ht '5\' 10"'$  (1.778 m)   Wt 184 lb 6.4 oz (83.6 kg)   SpO2 98%   BMI 26.46 kg/m? , BMI Body mass index is 26.46 kg/m?. ? ?Wt Readings from Last 3 Encounters:  ?02/14/22 184 lb 6.4 oz (83.6 kg)  ?11/08/20 189 lb (85.7 kg)  ?09/26/19 190 lb (86.2 kg)  ?  ?General: Patient appears comfortable at rest. ?HEENT: Conjunctiva and lids normal. ?Neck: Supple, no elevated JVP or carotid bruits, no thyromegaly. ?Lungs: Clear to auscultation, nonlabored breathing  at rest. ?Cardiac: Regular rate and rhythm, no S3 or significant systolic murmur, no pericardial rub. ?Extremities: No pitting edema. ? ?ECG:  An ECG dated 11/08/2020 was personally reviewed today and demonstrated:  Sinus bradycardia with old inferolateral infarct pattern. ? ?Recent Labwork: ? ?January 2022: Cholesterol 110, LDL 48, BUN 23, creatinine 1.66, potassium 4.6 ? ?Other Studies Reviewed Today: ? ?Carotid Dopplers 10/06/2020: ?Summary:  ?Right Carotid: Velocities in the right ICA are consistent with a 1-39%  ?stenosis.  ? ?Left Carotid: Velocities in the left ICA are consistent with a 1-39%  ?stenosis.  ? ?Vertebrals:  Bilateral vertebral arteries demonstrate antegrade flow.  ?Subclavians: Normal flow hemodynamics were seen in bilateral subclavian  ?              arteries.  ? ?Assessment and Plan: ? ?1.  CAD status post DES to the LAD and obtuse marginal in 2009.  He continues to prefer conservative management, no active angina or nitroglycerin use reported.  Plan to continue aspirin, HCTZ, Cozaar, Crestor, and as needed nitroglycerin.  We will stop Coreg completely to avoid issues with symptomatic bradycardia. ? ?2.  Essential hypertension, keep follow-up with PCP on HCTZ and Cozaar. ? ?3.  Mixed hyperlipidemia, continue Crestor.  Last LDL was 48. ? ?4.  CKD stage IIIb, last creatinine 1.66 and potassium normal. ? ?Medication Adjustments/Labs and Tests Ordered: ?Current medicines are reviewed at length with the patient today.  Concerns regarding medicines are outlined above.  ? ?Tests Ordered: ?Orders Placed This Encounter  ?Procedures  ? EKG 12-Lead  ? ? ?Medication Changes: ?No orders of the defined types were placed in this encounter. ? ? ?Disposition:  Follow up  6 months. ? ?Signed, ?Satira Sark, MD, [facility] ?02/14/2022 8:43 AM    ?[place] at [facility] ?[address], [place], [place] [address] ?Phone: [phone]; Fax: [phone]  ?

## 2022-02-14 ENCOUNTER — Encounter: Payer: Self-pay | Admitting: Cardiology

## 2022-02-14 ENCOUNTER — Ambulatory Visit (INDEPENDENT_AMBULATORY_CARE_PROVIDER_SITE_OTHER): Payer: Medicare Other | Admitting: Cardiology

## 2022-02-14 VITALS — BP 140/70 | HR 50 | Ht 70.0 in | Wt 184.4 lb

## 2022-02-14 DIAGNOSIS — N1832 Chronic kidney disease, stage 3b: Secondary | ICD-10-CM

## 2022-02-14 DIAGNOSIS — E782 Mixed hyperlipidemia: Secondary | ICD-10-CM

## 2022-02-14 DIAGNOSIS — I25119 Atherosclerotic heart disease of native coronary artery with unspecified angina pectoris: Secondary | ICD-10-CM

## 2022-02-14 DIAGNOSIS — I10 Essential (primary) hypertension: Secondary | ICD-10-CM

## 2022-02-14 NOTE — Patient Instructions (Signed)
Medication Instructions:  ?Your physician has recommended you make the following change in your medication:  ?Stop coreg ?Continue all other medications as directed ? ?Labwork: ?none ? ?Testing/Procedures: ?none ? ?Follow-Up: ?Your physician recommends that you schedule a follow-up appointment in: 6 months ? ?Any Other Special Instructions Will Be Listed Below (If Applicable). ? ?If you need a refill on your cardiac medications before your next appointment, please call your pharmacy.  ?

## 2022-02-15 DIAGNOSIS — Z1331 Encounter for screening for depression: Secondary | ICD-10-CM | POA: Diagnosis not present

## 2022-02-15 DIAGNOSIS — F5101 Primary insomnia: Secondary | ICD-10-CM | POA: Diagnosis not present

## 2022-02-15 DIAGNOSIS — N401 Enlarged prostate with lower urinary tract symptoms: Secondary | ICD-10-CM | POA: Diagnosis not present

## 2022-02-15 DIAGNOSIS — E1121 Type 2 diabetes mellitus with diabetic nephropathy: Secondary | ICD-10-CM | POA: Diagnosis not present

## 2022-02-15 DIAGNOSIS — Z0000 Encounter for general adult medical examination without abnormal findings: Secondary | ICD-10-CM | POA: Diagnosis not present

## 2022-02-15 DIAGNOSIS — E7849 Other hyperlipidemia: Secondary | ICD-10-CM | POA: Diagnosis not present

## 2022-02-15 DIAGNOSIS — I10 Essential (primary) hypertension: Secondary | ICD-10-CM | POA: Diagnosis not present

## 2022-02-15 DIAGNOSIS — N181 Chronic kidney disease, stage 1: Secondary | ICD-10-CM | POA: Diagnosis not present

## 2022-02-15 DIAGNOSIS — J40 Bronchitis, not specified as acute or chronic: Secondary | ICD-10-CM | POA: Diagnosis not present

## 2022-03-14 DIAGNOSIS — S83281A Other tear of lateral meniscus, current injury, right knee, initial encounter: Secondary | ICD-10-CM | POA: Diagnosis not present

## 2022-03-18 ENCOUNTER — Other Ambulatory Visit: Payer: Self-pay | Admitting: Cardiology

## 2022-05-18 DIAGNOSIS — E1121 Type 2 diabetes mellitus with diabetic nephropathy: Secondary | ICD-10-CM | POA: Diagnosis not present

## 2022-05-18 DIAGNOSIS — I10 Essential (primary) hypertension: Secondary | ICD-10-CM | POA: Diagnosis not present

## 2022-05-18 DIAGNOSIS — F5101 Primary insomnia: Secondary | ICD-10-CM | POA: Diagnosis not present

## 2022-05-18 DIAGNOSIS — Z0000 Encounter for general adult medical examination without abnormal findings: Secondary | ICD-10-CM | POA: Diagnosis not present

## 2022-05-18 DIAGNOSIS — E7849 Other hyperlipidemia: Secondary | ICD-10-CM | POA: Diagnosis not present

## 2022-05-18 DIAGNOSIS — J40 Bronchitis, not specified as acute or chronic: Secondary | ICD-10-CM | POA: Diagnosis not present

## 2022-05-18 DIAGNOSIS — N401 Enlarged prostate with lower urinary tract symptoms: Secondary | ICD-10-CM | POA: Diagnosis not present

## 2022-05-18 DIAGNOSIS — N181 Chronic kidney disease, stage 1: Secondary | ICD-10-CM | POA: Diagnosis not present

## 2022-05-18 DIAGNOSIS — Z1331 Encounter for screening for depression: Secondary | ICD-10-CM | POA: Diagnosis not present

## 2022-08-24 DIAGNOSIS — E1121 Type 2 diabetes mellitus with diabetic nephropathy: Secondary | ICD-10-CM | POA: Diagnosis not present

## 2022-08-24 DIAGNOSIS — L570 Actinic keratosis: Secondary | ICD-10-CM | POA: Diagnosis not present

## 2022-08-24 DIAGNOSIS — N401 Enlarged prostate with lower urinary tract symptoms: Secondary | ICD-10-CM | POA: Diagnosis not present

## 2022-08-24 DIAGNOSIS — N1831 Chronic kidney disease, stage 3a: Secondary | ICD-10-CM | POA: Diagnosis not present

## 2022-08-24 DIAGNOSIS — F5101 Primary insomnia: Secondary | ICD-10-CM | POA: Diagnosis not present

## 2022-08-24 DIAGNOSIS — E7849 Other hyperlipidemia: Secondary | ICD-10-CM | POA: Diagnosis not present

## 2022-08-24 DIAGNOSIS — N181 Chronic kidney disease, stage 1: Secondary | ICD-10-CM | POA: Diagnosis not present

## 2022-08-24 DIAGNOSIS — I10 Essential (primary) hypertension: Secondary | ICD-10-CM | POA: Diagnosis not present

## 2022-08-27 NOTE — Progress Notes (Unsigned)
Cardiology Office Note  Date: 08/28/2022   ID: Robert Banda Elena Sr., DOB December 02, 1940, MRN [id number]  PCP:  Neale Burly, MD  Cardiologist:  Rozann Lesches, MD Electrophysiologist:  None   Chief Complaint  Patient presents with   Cardiac follow-up    History of Present Illness: Robert Banda Torr Sr. is an 81 y.o. male last seen in May.  He is here for a routine visit.  Reports no angina or nitroglycerin use, stable NYHA class II dyspnea.  He has had no palpitations or syncope.  Still functional with ADLs including yard work.  I reviewed his medications which are outlined below.  Heart rate better since stopping Coreg.  He reports compliance with the remainder of his cardiac medications.  Blood pressure well controlled today.  His last LDL was 32.  Past Medical History:  Diagnosis Date   Carotid artery disease ([place])    Coronary atherosclerosis of native coronary artery    DES to LAD/OM Aug 2009   Essential hypertension    Mixed hyperlipidemia    Myocardial infarction ([place]) 05/16/2008   Out of hospital late presentation inferoposterolateral myocardial infarction   Sinus bradycardia    Tick bite 05/16/2013   Leg   Type 2 diabetes mellitus ([place])     Past Surgical History:  Procedure Laterality Date   NO PAST SURGERIES      Current Outpatient Medications  Medication Sig Dispense Refill   aspirin 81 MG tablet Take 81 mg by mouth daily. Reported on 02/04/2016     dutasteride (AVODART) 0.5 MG capsule Take 0.5 mg by mouth daily.     glipiZIDE (GLUCOTROL) 10 MG tablet Take 10 mg by mouth 2 (two) times daily before a meal.     hydrochlorothiazide (HYDRODIURIL) 25 MG tablet Take 1 tablet (25 mg total) by mouth daily. 90 tablet 3   loratadine (CLARITIN) 10 MG tablet Take 10 mg by mouth daily as needed.     losartan (COZAAR) 100 MG tablet Take 1 tablet by mouth once daily 90 tablet 3   metFORMIN (GLUCOPHAGE) 1000 MG tablet Take 1,000 mg by mouth daily with breakfast.      nitroGLYCERIN (NITROSTAT) 0.4 MG SL tablet Place 1 tablet (0.4 mg total) under the tongue every 5 (five) minutes as needed for chest pain. If no relief after 3rd dose, proceed to ED 25 tablet 3   rosuvastatin (CRESTOR) 5 MG tablet Take 1 tablet by mouth daily.     zolpidem (AMBIEN) 5 MG tablet Take 5 mg by mouth daily as needed.     No current facility-administered medications for this visit.   Allergies:  Patient has no known allergies.   ROS: No orthopnea or PND.  No leg swelling.  Physical Exam: VS:  BP 118/70   Pulse 64   Ht '5\' 10"'$  (1.778 m)   Wt 179 lb (81.2 kg)   SpO2 98%   BMI 25.68 kg/m , BMI Body mass index is 25.68 kg/m.  Wt Readings from Last 3 Encounters:  08/28/22 179 lb (81.2 kg)  02/14/22 184 lb 6.4 oz (83.6 kg)  11/08/20 189 lb (85.7 kg)    General: Patient appears comfortable at rest. HEENT: Conjunctiva and lids normal. Neck: Supple, no elevated JVP or carotid bruits. Lungs: Clear to auscultation, nonlabored breathing at rest. Cardiac: Regular rate and rhythm, no S3 or significant systolic murmur. Abdomen: Soft, nontender, bowel sounds present. Extremities: No pitting edema.  ECG:  An ECG dated 02/14/2022 was personally reviewed  today and demonstrated:  Sinus bradycardia with PACs, old inferior infarct pattern.  Recent Labwork:  August 2023: BUN 20, creatinine 1.31, potassium 4.9, cholesterol 93, triglycerides 151, HDL 35, LDL 32, hemoglobin A1c 9%,  Other Studies Reviewed Today:  Carotid Dopplers 10/06/2020: Summary:  Right Carotid: Velocities in the right ICA are consistent with a 1-39%  stenosis.   Left Carotid: Velocities in the left ICA are consistent with a 1-39%  stenosis.   Vertebrals:  Bilateral vertebral arteries demonstrate antegrade flow.  Subclavians: Normal flow hemodynamics were seen in bilateral subclavian               arteries.   Assessment and Plan:  1.  CAD status post DES to the LAD and obtuse marginal in 2009.  He is doing  well without angina or nitroglycerin use and prefers conservative management.  We will continue with observation on medical therapy including aspirin, Cozaar, Crestor, and as needed nitroglycerin.  Coreg discontinued due to bradycardia.  2.  Essential hypertension, also on HCTZ.  Blood pressure well controlled today.  Keep follow-up with PCP.  3.  Hyperlipidemia, on Crestor with last LDL 32.   4.  CKD stage IIIb, creatinine 1.31 with normal potassium.  Medication Adjustments/Labs and Tests Ordered: Current medicines are reviewed at length with the patient today.  Concerns regarding medicines are outlined above.   Tests Ordered: No orders of the defined types were placed in this encounter.   Medication Changes: No orders of the defined types were placed in this encounter.   Disposition:  Follow up  6 months.  Signed, Satira Sark, MD, [facility] 08/28/2022 9:42 AM    [place] at [place], [place], [place] [address] Phone: [phone]; Fax: [phone]

## 2022-08-28 ENCOUNTER — Ambulatory Visit: Payer: Medicare Other | Attending: Cardiology | Admitting: Cardiology

## 2022-08-28 ENCOUNTER — Encounter: Payer: Self-pay | Admitting: Cardiology

## 2022-08-28 VITALS — BP 118/70 | HR 64 | Ht 70.0 in | Wt 179.0 lb

## 2022-08-28 DIAGNOSIS — I25119 Atherosclerotic heart disease of native coronary artery with unspecified angina pectoris: Secondary | ICD-10-CM

## 2022-08-28 DIAGNOSIS — E782 Mixed hyperlipidemia: Secondary | ICD-10-CM | POA: Diagnosis not present

## 2022-08-28 DIAGNOSIS — N1832 Chronic kidney disease, stage 3b: Secondary | ICD-10-CM | POA: Diagnosis not present

## 2022-08-28 DIAGNOSIS — I10 Essential (primary) hypertension: Secondary | ICD-10-CM | POA: Diagnosis not present

## 2022-08-28 NOTE — Patient Instructions (Addendum)

## 2022-12-13 DIAGNOSIS — N181 Chronic kidney disease, stage 1: Secondary | ICD-10-CM | POA: Diagnosis not present

## 2022-12-13 DIAGNOSIS — I10 Essential (primary) hypertension: Secondary | ICD-10-CM | POA: Diagnosis not present

## 2022-12-13 DIAGNOSIS — J040 Acute laryngitis: Secondary | ICD-10-CM | POA: Diagnosis not present

## 2022-12-13 DIAGNOSIS — L570 Actinic keratosis: Secondary | ICD-10-CM | POA: Diagnosis not present

## 2022-12-13 DIAGNOSIS — E7849 Other hyperlipidemia: Secondary | ICD-10-CM | POA: Diagnosis not present

## 2022-12-13 DIAGNOSIS — N401 Enlarged prostate with lower urinary tract symptoms: Secondary | ICD-10-CM | POA: Diagnosis not present

## 2022-12-13 DIAGNOSIS — E1121 Type 2 diabetes mellitus with diabetic nephropathy: Secondary | ICD-10-CM | POA: Diagnosis not present

## 2022-12-13 DIAGNOSIS — F5101 Primary insomnia: Secondary | ICD-10-CM | POA: Diagnosis not present

## 2022-12-13 DIAGNOSIS — N1831 Chronic kidney disease, stage 3a: Secondary | ICD-10-CM | POA: Diagnosis not present

## 2023-02-26 DIAGNOSIS — M1711 Unilateral primary osteoarthritis, right knee: Secondary | ICD-10-CM | POA: Diagnosis not present

## 2023-03-02 NOTE — Progress Notes (Unsigned)
    Cardiology Office Note  Date: 03/05/2023   ID: Robert Robert Gladu Sr., DOB Oct 09, 1941, MRN [id number]  History of Present Illness: Robert Glowinski Ambrose Sr. is an 82 y.o. male last seen in November 2023.  He is here for a routine visit.  Reports no angina or nitroglycerin use, functional with ADLs including yard work with no reported change in stamina.  No palpitations or syncope.  I reviewed his medications, he reports compliance with therapy and no intolerances.  He has done well on low-dose Crestor, his LDL was 33 in February.  ECG today shows sinus rhythm with PACs and old inferior infarct pattern.  Physical Exam: VS:  BP 138/70   Pulse 60   Ht 5\' 10"  (1.778 m)   Wt 171 lb 9.6 oz (77.8 kg)   SpO2 97%   BMI 24.62 kg/m , BMI Body mass index is 24.62 kg/m.  Wt Readings from Last 3 Encounters:  03/05/23 171 lb 9.6 oz (77.8 kg)  08/28/22 179 lb (81.2 kg)  02/14/22 184 lb 6.4 oz (83.6 kg)    General: Patient appears comfortable at rest. HEENT: Conjunctiva and lids normal. Neck: Supple, no elevated JVP or carotid bruits. Lungs: Clear to auscultation, nonlabored breathing at rest. Cardiac: Regular rate and rhythm, no S3 or significant systolic murmur. Extremities: No pitting edema.  ECG:  An ECG dated 02/14/2022 was personally reviewed today and demonstrated:  Sinus bradycardia with PACs, old inferior infarct pattern.  Labwork:  February 2024: BUN 19, creatinine 1.28, potassium 4.7, cholesterol 90, triglycerides 128, HDL 34, LDL 33, hemoglobin A1c 8.1%  Other Studies Reviewed Today:  No interval cardiac testing for review today.  Assessment and Plan:  1.  CAD status post DES to the LAD/OM in August 2009.  He suffered an out of hospital inferoposterior infarct at that time.  LVEF 51% by Myoview in 2016 with evidence of inferolateral infarct scar but no ischemia.  He has preferred conservative follow-up in the absence of angina.  Continues to do well without interval symptoms.  ECG  reviewed and stable.  Continue aspirin, Cozaar, Crestor, and as needed nitroglycerin.  2.  Essential hypertension.  Blood pressure control is adequate, he is also on hydrochlorothiazide.  I reviewed his recent lab work from PCP.  3.  Mixed hyperlipidemia, on Crestor.  LDL 33 in February.  4.  CKD stage IIIb.  Creatinine 1.28 in February.  Disposition:  Follow up  6 months.  Signed, Jonelle Sidle, M.D., F.A.C.C. [facility] at [facility]

## 2023-03-05 ENCOUNTER — Encounter: Payer: Self-pay | Admitting: Cardiology

## 2023-03-05 ENCOUNTER — Ambulatory Visit: Payer: Medicare Other | Attending: Cardiology | Admitting: Cardiology

## 2023-03-05 VITALS — BP 138/70 | HR 60 | Ht 70.0 in | Wt 171.6 lb

## 2023-03-05 DIAGNOSIS — I25119 Atherosclerotic heart disease of native coronary artery with unspecified angina pectoris: Secondary | ICD-10-CM | POA: Insufficient documentation

## 2023-03-05 DIAGNOSIS — N1832 Chronic kidney disease, stage 3b: Secondary | ICD-10-CM | POA: Diagnosis not present

## 2023-03-05 DIAGNOSIS — E782 Mixed hyperlipidemia: Secondary | ICD-10-CM | POA: Insufficient documentation

## 2023-03-05 DIAGNOSIS — I10 Essential (primary) hypertension: Secondary | ICD-10-CM | POA: Insufficient documentation

## 2023-03-05 NOTE — Addendum Note (Signed)
Addended by: Eustace Moore on: 03/05/2023 09:51 AM   Modules accepted: Orders

## 2023-03-05 NOTE — Patient Instructions (Addendum)

## 2023-03-14 DIAGNOSIS — I10 Essential (primary) hypertension: Secondary | ICD-10-CM | POA: Diagnosis not present

## 2023-03-14 DIAGNOSIS — E1121 Type 2 diabetes mellitus with diabetic nephropathy: Secondary | ICD-10-CM | POA: Diagnosis not present

## 2023-03-14 DIAGNOSIS — Z0000 Encounter for general adult medical examination without abnormal findings: Secondary | ICD-10-CM | POA: Diagnosis not present

## 2023-03-14 DIAGNOSIS — E7849 Other hyperlipidemia: Secondary | ICD-10-CM | POA: Diagnosis not present

## 2023-03-14 DIAGNOSIS — N401 Enlarged prostate with lower urinary tract symptoms: Secondary | ICD-10-CM | POA: Diagnosis not present

## 2023-03-14 DIAGNOSIS — Z1331 Encounter for screening for depression: Secondary | ICD-10-CM | POA: Diagnosis not present

## 2023-03-14 DIAGNOSIS — F5101 Primary insomnia: Secondary | ICD-10-CM | POA: Diagnosis not present

## 2023-03-14 DIAGNOSIS — M25561 Pain in right knee: Secondary | ICD-10-CM | POA: Diagnosis not present

## 2023-03-14 DIAGNOSIS — N1831 Chronic kidney disease, stage 3a: Secondary | ICD-10-CM | POA: Diagnosis not present

## 2023-03-16 ENCOUNTER — Other Ambulatory Visit: Payer: Self-pay | Admitting: Cardiology

## 2023-03-19 DIAGNOSIS — D485 Neoplasm of uncertain behavior of skin: Secondary | ICD-10-CM | POA: Diagnosis not present

## 2023-03-19 DIAGNOSIS — L570 Actinic keratosis: Secondary | ICD-10-CM | POA: Diagnosis not present

## 2023-03-29 DIAGNOSIS — C44321 Squamous cell carcinoma of skin of nose: Secondary | ICD-10-CM | POA: Diagnosis not present

## 2023-06-13 DIAGNOSIS — E7849 Other hyperlipidemia: Secondary | ICD-10-CM | POA: Diagnosis not present

## 2023-06-13 DIAGNOSIS — E1121 Type 2 diabetes mellitus with diabetic nephropathy: Secondary | ICD-10-CM | POA: Diagnosis not present

## 2023-06-13 DIAGNOSIS — N1831 Chronic kidney disease, stage 3a: Secondary | ICD-10-CM | POA: Diagnosis not present

## 2023-06-13 DIAGNOSIS — Z1331 Encounter for screening for depression: Secondary | ICD-10-CM | POA: Diagnosis not present

## 2023-06-13 DIAGNOSIS — M25561 Pain in right knee: Secondary | ICD-10-CM | POA: Diagnosis not present

## 2023-06-13 DIAGNOSIS — F5101 Primary insomnia: Secondary | ICD-10-CM | POA: Diagnosis not present

## 2023-06-13 DIAGNOSIS — I10 Essential (primary) hypertension: Secondary | ICD-10-CM | POA: Diagnosis not present

## 2023-06-13 DIAGNOSIS — Z0000 Encounter for general adult medical examination without abnormal findings: Secondary | ICD-10-CM | POA: Diagnosis not present

## 2023-06-13 DIAGNOSIS — N401 Enlarged prostate with lower urinary tract symptoms: Secondary | ICD-10-CM | POA: Diagnosis not present

## 2023-06-25 DIAGNOSIS — D0439 Carcinoma in situ of skin of other parts of face: Secondary | ICD-10-CM | POA: Diagnosis not present

## 2023-06-25 DIAGNOSIS — L570 Actinic keratosis: Secondary | ICD-10-CM | POA: Diagnosis not present

## 2023-06-25 DIAGNOSIS — C44329 Squamous cell carcinoma of skin of other parts of face: Secondary | ICD-10-CM | POA: Diagnosis not present

## 2023-06-25 DIAGNOSIS — D485 Neoplasm of uncertain behavior of skin: Secondary | ICD-10-CM | POA: Diagnosis not present

## 2023-06-25 DIAGNOSIS — C44229 Squamous cell carcinoma of skin of left ear and external auricular canal: Secondary | ICD-10-CM | POA: Diagnosis not present

## 2023-06-25 DIAGNOSIS — M1711 Unilateral primary osteoarthritis, right knee: Secondary | ICD-10-CM | POA: Diagnosis not present

## 2023-07-05 DIAGNOSIS — C44329 Squamous cell carcinoma of skin of other parts of face: Secondary | ICD-10-CM | POA: Diagnosis not present

## 2023-07-05 DIAGNOSIS — H00012 Hordeolum externum right lower eyelid: Secondary | ICD-10-CM | POA: Diagnosis not present

## 2023-07-05 DIAGNOSIS — C44229 Squamous cell carcinoma of skin of left ear and external auricular canal: Secondary | ICD-10-CM | POA: Diagnosis not present

## 2023-09-12 ENCOUNTER — Encounter: Payer: Self-pay | Admitting: Cardiology

## 2023-09-12 ENCOUNTER — Ambulatory Visit: Payer: Medicare Other | Attending: Cardiology | Admitting: Cardiology

## 2023-09-12 VITALS — BP 132/74 | HR 55 | Ht 70.0 in | Wt 173.4 lb

## 2023-09-12 DIAGNOSIS — I10 Essential (primary) hypertension: Secondary | ICD-10-CM | POA: Insufficient documentation

## 2023-09-12 DIAGNOSIS — I25119 Atherosclerotic heart disease of native coronary artery with unspecified angina pectoris: Secondary | ICD-10-CM | POA: Diagnosis not present

## 2023-09-12 DIAGNOSIS — N1832 Chronic kidney disease, stage 3b: Secondary | ICD-10-CM | POA: Diagnosis not present

## 2023-09-12 DIAGNOSIS — E782 Mixed hyperlipidemia: Secondary | ICD-10-CM | POA: Insufficient documentation

## 2023-09-12 NOTE — Patient Instructions (Signed)

## 2023-09-12 NOTE — Progress Notes (Signed)
    Cardiology Office Note  Date: 09/12/2023   ID: Robert Memory Trella Sr., DOB 18-Sep-1941, MRN [id number]  History of Present Illness: Robert Standage Feild Sr. is an 82 y.o. male last seen in May.  He is here for a routine visit.  He reports no angina or nitroglycerin use, stable NYHA class II dyspnea, no palpitations or syncope.  No definite change in stamina.  I reviewed his medications.  Current cardiac regimen includes aspirin, HCTZ, Cozaar, Crestor, and as needed nitroglycerin.  I reviewed his interval lab work, LDL 39 in August of this year.  He continues to follow with Dr. Olena Leatherwood.  Physical Exam: VS:  BP 132/74 (BP Location: Left Arm)   Pulse (!) 55   Ht 5\' 10"  (1.778 m)   Wt 173 lb 6.4 oz (78.7 kg)   SpO2 99%   BMI 24.88 kg/m , BMI Body mass index is 24.88 kg/m.  Wt Readings from Last 3 Encounters:  09/12/23 173 lb 6.4 oz (78.7 kg)  03/05/23 171 lb 9.6 oz (77.8 kg)  08/28/22 179 lb (81.2 kg)    General: Patient appears comfortable at rest. HEENT: Conjunctiva and lids normal. Neck: Supple, no elevated JVP or carotid bruits. Lungs: Clear to auscultation, nonlabored breathing at rest. Cardiac: Regular rate and rhythm, no S3 or significant systolic murmur. Extremities: No pitting edema.  ECG:  An ECG dated 03/05/2023 was personally reviewed today and demonstrated:  Sinus rhythm with PACs and old inferior infarct pattern.  Labwork:  August 2024: BUN 19, creatinine 1.3, potassium 4.9, cholesterol 100, triglycerides 104, HDL 41, LDL 39, hemoglobin A1c 7.5%  Other Studies Reviewed Today:  No interval cardiac testing for review today.  Assessment and Plan:  1.  CAD status post DES to the LAD/OM in August 2009.  He suffered an out of hospital inferoposterior infarct at that time.  LVEF 51% by Myoview in 2016 with evidence of inferolateral infarct scar but no ischemia.  He has preferred conservative follow-up in the absence of angina.  Continues to do well as discussed above on  current regimen.  Continue aspirin, Crestor, and as needed nitroglycerin.   2.  Primary hypertension.  Blood pressure reasonable today, continue HCTZ and Cozaar.   3.  Mixed hyperlipidemia, on Crestor.  LDL 39 in August of this year.   4.  CKD stage IIIb.  Creatinine 1.3 in August of this year.  Disposition:  Follow up  6 months.  Signed, Jonelle Sidle, M.D., F.A.C.C. [facility] at [facility]

## 2023-09-17 DIAGNOSIS — M25561 Pain in right knee: Secondary | ICD-10-CM | POA: Diagnosis not present

## 2023-09-17 DIAGNOSIS — E7849 Other hyperlipidemia: Secondary | ICD-10-CM | POA: Diagnosis not present

## 2023-09-17 DIAGNOSIS — N401 Enlarged prostate with lower urinary tract symptoms: Secondary | ICD-10-CM | POA: Diagnosis not present

## 2023-09-17 DIAGNOSIS — N1831 Chronic kidney disease, stage 3a: Secondary | ICD-10-CM | POA: Diagnosis not present

## 2023-09-17 DIAGNOSIS — E1122 Type 2 diabetes mellitus with diabetic chronic kidney disease: Secondary | ICD-10-CM | POA: Diagnosis not present

## 2023-09-17 DIAGNOSIS — I10 Essential (primary) hypertension: Secondary | ICD-10-CM | POA: Diagnosis not present

## 2023-09-17 DIAGNOSIS — F5101 Primary insomnia: Secondary | ICD-10-CM | POA: Diagnosis not present

## 2023-09-18 DIAGNOSIS — D044 Carcinoma in situ of skin of scalp and neck: Secondary | ICD-10-CM | POA: Diagnosis not present

## 2023-09-18 DIAGNOSIS — D0422 Carcinoma in situ of skin of left ear and external auricular canal: Secondary | ICD-10-CM | POA: Diagnosis not present

## 2023-09-18 DIAGNOSIS — D485 Neoplasm of uncertain behavior of skin: Secondary | ICD-10-CM | POA: Diagnosis not present

## 2023-09-18 DIAGNOSIS — L570 Actinic keratosis: Secondary | ICD-10-CM | POA: Diagnosis not present

## 2023-10-04 DIAGNOSIS — C4442 Squamous cell carcinoma of skin of scalp and neck: Secondary | ICD-10-CM | POA: Diagnosis not present

## 2023-10-04 DIAGNOSIS — C44229 Squamous cell carcinoma of skin of left ear and external auricular canal: Secondary | ICD-10-CM | POA: Diagnosis not present

## 2023-12-18 DIAGNOSIS — I10 Essential (primary) hypertension: Secondary | ICD-10-CM | POA: Diagnosis not present

## 2023-12-18 DIAGNOSIS — N401 Enlarged prostate with lower urinary tract symptoms: Secondary | ICD-10-CM | POA: Diagnosis not present

## 2023-12-18 DIAGNOSIS — M25561 Pain in right knee: Secondary | ICD-10-CM | POA: Diagnosis not present

## 2023-12-18 DIAGNOSIS — F5101 Primary insomnia: Secondary | ICD-10-CM | POA: Diagnosis not present

## 2023-12-18 DIAGNOSIS — N1831 Chronic kidney disease, stage 3a: Secondary | ICD-10-CM | POA: Diagnosis not present

## 2023-12-18 DIAGNOSIS — E1122 Type 2 diabetes mellitus with diabetic chronic kidney disease: Secondary | ICD-10-CM | POA: Diagnosis not present

## 2023-12-18 DIAGNOSIS — E7849 Other hyperlipidemia: Secondary | ICD-10-CM | POA: Diagnosis not present

## 2024-01-21 DIAGNOSIS — M1711 Unilateral primary osteoarthritis, right knee: Secondary | ICD-10-CM | POA: Diagnosis not present

## 2024-02-12 DIAGNOSIS — D485 Neoplasm of uncertain behavior of skin: Secondary | ICD-10-CM | POA: Diagnosis not present

## 2024-03-18 DIAGNOSIS — C44229 Squamous cell carcinoma of skin of left ear and external auricular canal: Secondary | ICD-10-CM | POA: Diagnosis not present

## 2024-03-19 ENCOUNTER — Ambulatory Visit: Payer: Medicare Other | Attending: Cardiology | Admitting: Cardiology

## 2024-03-20 ENCOUNTER — Encounter: Payer: Self-pay | Admitting: Cardiology

## 2024-04-02 DIAGNOSIS — E1122 Type 2 diabetes mellitus with diabetic chronic kidney disease: Secondary | ICD-10-CM | POA: Diagnosis not present

## 2024-04-02 DIAGNOSIS — I10 Essential (primary) hypertension: Secondary | ICD-10-CM | POA: Diagnosis not present

## 2024-04-02 DIAGNOSIS — E7849 Other hyperlipidemia: Secondary | ICD-10-CM | POA: Diagnosis not present

## 2024-04-02 DIAGNOSIS — M25561 Pain in right knee: Secondary | ICD-10-CM | POA: Diagnosis not present

## 2024-04-02 DIAGNOSIS — N182 Chronic kidney disease, stage 2 (mild): Secondary | ICD-10-CM | POA: Diagnosis not present

## 2024-04-02 DIAGNOSIS — Z1331 Encounter for screening for depression: Secondary | ICD-10-CM | POA: Diagnosis not present

## 2024-04-02 DIAGNOSIS — F5101 Primary insomnia: Secondary | ICD-10-CM | POA: Diagnosis not present

## 2024-04-02 DIAGNOSIS — Z0000 Encounter for general adult medical examination without abnormal findings: Secondary | ICD-10-CM | POA: Diagnosis not present

## 2024-04-02 DIAGNOSIS — N401 Enlarged prostate with lower urinary tract symptoms: Secondary | ICD-10-CM | POA: Diagnosis not present

## 2024-04-09 DIAGNOSIS — C44619 Basal cell carcinoma of skin of left upper limb, including shoulder: Secondary | ICD-10-CM | POA: Diagnosis not present

## 2024-05-07 DIAGNOSIS — D0439 Carcinoma in situ of skin of other parts of face: Secondary | ICD-10-CM | POA: Diagnosis not present

## 2024-06-30 ENCOUNTER — Ambulatory Visit: Attending: Cardiology | Admitting: Cardiology

## 2024-06-30 ENCOUNTER — Encounter: Payer: Self-pay | Admitting: Cardiology

## 2024-06-30 VITALS — BP 138/82 | HR 54 | Ht 70.0 in | Wt 177.2 lb

## 2024-06-30 DIAGNOSIS — I25119 Atherosclerotic heart disease of native coronary artery with unspecified angina pectoris: Secondary | ICD-10-CM | POA: Diagnosis not present

## 2024-06-30 DIAGNOSIS — I10 Essential (primary) hypertension: Secondary | ICD-10-CM | POA: Insufficient documentation

## 2024-06-30 DIAGNOSIS — E782 Mixed hyperlipidemia: Secondary | ICD-10-CM | POA: Insufficient documentation

## 2024-06-30 NOTE — Patient Instructions (Addendum)

## 2024-06-30 NOTE — Progress Notes (Signed)
    Cardiology Office Note  Date: 06/30/2024   ID: Robert BRAVO Wickey Sr., DOB [birth year], MRN [id number]  History of Present Illness: Robert Hitz Raske Sr. is an 83 y.o. male last seen in November 2024.  He is here for a follow-up visit.  Reports no angina or interval nitroglycerin  use, stable NYHA class II dyspnea.  Still functional with ADLs.  Medications reviewed.  He reports compliance with his current regimen.  He continues to follow with Dr. Orpha.  I reviewed his interval lab work, LDL 35 in March.  I reviewed his ECG today which shows sinus bradycardia with nonspecific T wave changes and probable old inferior infarct pattern.  Physical Exam: VS:  BP 138/82 (BP Location: Left Arm)   Pulse (!) 54   Ht 5' 10 (1.778 m)   Wt 177 lb 3.2 oz (80.4 kg)   SpO2 98%   BMI 25.43 kg/m , BMI Body mass index is 25.43 kg/m.  Wt Readings from Last 3 Encounters:  06/30/24 177 lb 3.2 oz (80.4 kg)  09/12/23 173 lb 6.4 oz (78.7 kg)  03/05/23 171 lb 9.6 oz (77.8 kg)    General: Patient appears comfortable at rest. HEENT: Conjunctiva and lids normal. Neck: Supple, no elevated JVP or carotid bruits. Lungs: Clear to auscultation, nonlabored breathing at rest. Cardiac: Regular rate and rhythm, no S3 or significant systolic murmur. Extremities: No pitting edema.  ECG:  An ECG dated 03/05/2023 was personally reviewed today and demonstrated:  Sinus rhythm with PACs and old inferior infarct pattern.  Labwork:  March 2025: BUN 17, creatinine 1.2, GFR 60, potassium 5.3, cholesterol 104, triglycerides 197, HDL 37, LDL 35  Other Studies Reviewed Today:  No interval cardiac testing for review today.  Assessment and Plan:  1.  CAD status post DES to the LAD/OM in August 2009.  He suffered an out of hospital inferoposterior infarct at that time.  LVEF 51% by Myoview in 2016 with evidence of inferolateral infarct scar but no ischemia.  He has preferred conservative follow-up in the absence of angina.   No interval symptoms or nitroglycerin  use reported.  I reviewed his ECG.  Continue aspirin 81 mg daily, Crestor  5 mg daily, and as needed nitroglycerin .   2.  Primary hypertension.  Continue Cozaar  100 mg daily and hydrochlorothiazide  25 mg daily.   3.  Mixed hyperlipidemia.  LDL 35 in March.  Continue Crestor  5 mg daily.   4.  History of CKD stage IIIb.  Creatinine improved to 1.2 with GFR 60 in March.  Disposition:  Follow up 6 months.  Signed, Jayson JUDITHANN Sierras, M.D., F.A.C.C. [facility] at [facility]

## 2024-07-16 DIAGNOSIS — E1122 Type 2 diabetes mellitus with diabetic chronic kidney disease: Secondary | ICD-10-CM | POA: Diagnosis not present

## 2024-07-16 DIAGNOSIS — E7849 Other hyperlipidemia: Secondary | ICD-10-CM | POA: Diagnosis not present

## 2024-07-16 DIAGNOSIS — F5101 Primary insomnia: Secondary | ICD-10-CM | POA: Diagnosis not present

## 2024-07-16 DIAGNOSIS — N401 Enlarged prostate with lower urinary tract symptoms: Secondary | ICD-10-CM | POA: Diagnosis not present

## 2024-07-16 DIAGNOSIS — N182 Chronic kidney disease, stage 2 (mild): Secondary | ICD-10-CM | POA: Diagnosis not present

## 2024-07-16 DIAGNOSIS — M25561 Pain in right knee: Secondary | ICD-10-CM | POA: Diagnosis not present

## 2024-07-16 DIAGNOSIS — I10 Essential (primary) hypertension: Secondary | ICD-10-CM | POA: Diagnosis not present

## 2024-07-22 DIAGNOSIS — N182 Chronic kidney disease, stage 2 (mild): Secondary | ICD-10-CM | POA: Diagnosis not present

## 2024-07-22 DIAGNOSIS — E1122 Type 2 diabetes mellitus with diabetic chronic kidney disease: Secondary | ICD-10-CM | POA: Diagnosis not present

## 2024-08-22 DIAGNOSIS — N182 Chronic kidney disease, stage 2 (mild): Secondary | ICD-10-CM | POA: Diagnosis not present

## 2024-08-22 DIAGNOSIS — E1122 Type 2 diabetes mellitus with diabetic chronic kidney disease: Secondary | ICD-10-CM | POA: Diagnosis not present

## 2024-12-23 ENCOUNTER — Ambulatory Visit: Admitting: Cardiology
